# Patient Record
Sex: Male | Born: 1985 | Race: White | Hispanic: No | Marital: Single | Smoking: Current every day smoker
Health system: Southern US, Community
[De-identification: ages and names within clinical notes are randomized; demographics above are authoritative.]

## PROBLEM LIST (undated history)

## (undated) HISTORY — PX: OTHER SURGICAL HISTORY: SHX169

---
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
## 2006-08-13 ENCOUNTER — Emergency Department (HOSPITAL_COMMUNITY): Admission: EM | Admit: 2006-08-13 | Discharge: 2006-08-13 | Payer: Self-pay | Admitting: Emergency Medicine

## 2007-01-24 ENCOUNTER — Emergency Department (HOSPITAL_COMMUNITY): Admission: EM | Admit: 2007-01-24 | Discharge: 2007-01-24 | Payer: Self-pay | Admitting: Emergency Medicine

## 2007-01-24 ENCOUNTER — Ambulatory Visit: Payer: Self-pay | Admitting: Psychiatry

## 2007-01-24 ENCOUNTER — Inpatient Hospital Stay (HOSPITAL_COMMUNITY): Admission: RE | Admit: 2007-01-24 | Discharge: 2007-01-26 | Payer: Self-pay | Admitting: Psychiatry

## 2007-01-29 ENCOUNTER — Inpatient Hospital Stay (HOSPITAL_COMMUNITY): Admission: EM | Admit: 2007-01-29 | Discharge: 2007-01-29 | Payer: Self-pay | Admitting: Emergency Medicine

## 2007-06-17 ENCOUNTER — Emergency Department (HOSPITAL_COMMUNITY): Admission: EM | Admit: 2007-06-17 | Discharge: 2007-06-18 | Payer: Self-pay | Admitting: Emergency Medicine

## 2007-08-08 ENCOUNTER — Emergency Department (HOSPITAL_COMMUNITY): Admission: EM | Admit: 2007-08-08 | Discharge: 2007-08-08 | Payer: Self-pay | Admitting: Emergency Medicine

## 2007-09-15 ENCOUNTER — Emergency Department (HOSPITAL_COMMUNITY): Admission: EM | Admit: 2007-09-15 | Discharge: 2007-09-15 | Payer: Self-pay | Admitting: Emergency Medicine

## 2007-09-25 ENCOUNTER — Emergency Department (HOSPITAL_COMMUNITY): Admission: EM | Admit: 2007-09-25 | Discharge: 2007-09-25 | Payer: Self-pay | Admitting: Emergency Medicine

## 2007-11-02 ENCOUNTER — Emergency Department (HOSPITAL_COMMUNITY): Admission: EM | Admit: 2007-11-02 | Discharge: 2007-11-02 | Payer: Self-pay | Admitting: Emergency Medicine

## 2008-03-04 ENCOUNTER — Emergency Department (HOSPITAL_COMMUNITY): Admission: EM | Admit: 2008-03-04 | Discharge: 2008-03-04 | Payer: Self-pay | Admitting: Emergency Medicine

## 2008-03-20 ENCOUNTER — Emergency Department (HOSPITAL_COMMUNITY): Admission: EM | Admit: 2008-03-20 | Discharge: 2008-03-20 | Payer: Self-pay | Admitting: Emergency Medicine

## 2010-08-21 ENCOUNTER — Encounter: Payer: Self-pay | Admitting: Emergency Medicine
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
## 2011-01-30 ENCOUNTER — Emergency Department (HOSPITAL_COMMUNITY)
Admission: EM | Admit: 2011-01-30 | Discharge: 2011-01-30 | Disposition: A | Discharge status: Home / Self Care | Payer: Self-pay | Attending: Emergency Medicine | Admitting: Emergency Medicine

## 2011-01-30 ENCOUNTER — Emergency Department (HOSPITAL_COMMUNITY)
Admission: EM | Admit: 2011-01-30 | Discharge: 2011-01-31 | Disposition: A | Discharge status: Home / Self Care | Payer: Self-pay | Attending: Emergency Medicine | Admitting: Emergency Medicine

## 2011-01-30 ENCOUNTER — Emergency Department (HOSPITAL_COMMUNITY): Payer: Self-pay

## 2011-01-30 DIAGNOSIS — S41109A Unspecified open wound of unspecified upper arm, initial encounter: Secondary | ICD-10-CM | POA: Insufficient documentation

## 2011-01-30 DIAGNOSIS — Z79899 Other long term (current) drug therapy: Secondary | ICD-10-CM | POA: Insufficient documentation

## 2011-01-30 DIAGNOSIS — M7989 Other specified soft tissue disorders: Secondary | ICD-10-CM | POA: Insufficient documentation

## 2011-01-30 DIAGNOSIS — M25529 Pain in unspecified elbow: Secondary | ICD-10-CM | POA: Insufficient documentation

## 2011-01-30 DIAGNOSIS — M25519 Pain in unspecified shoulder: Secondary | ICD-10-CM | POA: Insufficient documentation

## 2011-02-06 ENCOUNTER — Emergency Department (HOSPITAL_COMMUNITY)
Admission: EM | Admit: 2011-02-06 | Discharge: 2011-02-06 | Disposition: A | Discharge status: Home / Self Care | Payer: Self-pay | Attending: Emergency Medicine | Admitting: Emergency Medicine

## 2011-02-06 DIAGNOSIS — Z4802 Encounter for removal of sutures: Secondary | ICD-10-CM | POA: Insufficient documentation

## 2011-02-06 MED ORDER — HYDROCODONE-ACETAMINOPHEN 5-325 MG PO TABS: 1.0000 | ORAL_TABLET | ORAL | Status: AC | PRN
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
## 2011-02-06 NOTE — ED Notes (Signed)
Here for suture removal-left bicep

## 2011-02-06 NOTE — ED Notes (Signed)
EDPA at bedside to examine 

## 2011-02-06 NOTE — ED Notes (Signed)
Pt presents for suture removal to left arm. Pt states sutures were placed 10 days ago.
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
## 2011-04-21 LAB — INFLUENZA A+B VIRUS AG-DIRECT(RAPID)
Inflenza A Ag: NEGATIVE
Influenza B Ag: NEGATIVE

## 2011-04-21 LAB — STREP A DNA PROBE: Group A Strep Probe: NEGATIVE

## 2011-04-21 LAB — RAPID STREP SCREEN (MED CTR MEBANE ONLY): Streptococcus, Group A Screen (Direct): NEGATIVE

## 2011-05-09 LAB — URINALYSIS, ROUTINE W REFLEX MICROSCOPIC
Ketones, ur: NEGATIVE
Leukocytes, UA: NEGATIVE
Nitrite: NEGATIVE
Specific Gravity, Urine: 1.01
Urobilinogen, UA: 0.2
pH: 6

## 2011-05-09 LAB — BASIC METABOLIC PANEL
CO2: 29
Chloride: 108
GFR calc Af Amer: >60
Potassium: 3.5
Sodium: 145

## 2011-05-09 LAB — CBC
Hemoglobin: 16
MCHC: 34.1
MCV: 89.7
RBC: 5.23

## 2011-05-09 LAB — RAPID URINE DRUG SCREEN, HOSP PERFORMED
Amphetamines: NOT DETECTED
Cocaine: NOT DETECTED
Tetrahydrocannabinol: POSITIVE — AB

## 2011-05-09 LAB — DIFFERENTIAL
Basophils Relative: 0
Eosinophils Absolute: 0.1 — ABNORMAL LOW
Lymphs Abs: 2
Monocytes Absolute: 0.7
Monocytes Relative: 7

## 2011-05-16 LAB — BASIC METABOLIC PANEL
CO2: 29
Calcium: 9.5
Creatinine, Ser: 0.75
GFR calc non Af Amer: >60
Glucose, Bld: 98
Sodium: 135

## 2011-05-16 LAB — CBC
Hemoglobin: 15.8
MCHC: 34
Platelets: 260
RDW: 13.8

## 2011-05-17 LAB — COMPREHENSIVE METABOLIC PANEL
AST: 24
Albumin: 4.3
Alkaline Phosphatase: 69
BUN: 7
Calcium: 9.6
Creatinine, Ser: 0.9
GFR calc non Af Amer: >60
Glucose, Bld: 109 — ABNORMAL HIGH
Sodium: 141
Total Bilirubin: 0.7
Total Protein: 7.1

## 2011-05-17 LAB — DIFFERENTIAL
Basophils Absolute: 0.1
Basophils Relative: 0
Basophils Relative: 0
Eosinophils Absolute: 0
Eosinophils Absolute: 0
Eosinophils Absolute: 0.1
Eosinophils Relative: 0
Lymphs Abs: 1.6
Monocytes Absolute: 0.9 — ABNORMAL HIGH
Monocytes Absolute: 1 — ABNORMAL HIGH
Monocytes Absolute: 1.2 — ABNORMAL HIGH
Monocytes Relative: 10
Monocytes Relative: 6
Neutrophils Relative %: 77

## 2011-05-17 LAB — CBC
HCT: 44.8
Hemoglobin: 15.4
Hemoglobin: 16
Hemoglobin: 16.7
MCHC: 34.1
MCV: 89.9
MCV: 90.2
MCV: 90.9
Platelets: 227
RBC: 4.96
RBC: 5.23
RBC: 5.38
WBC: 12.6 — ABNORMAL HIGH
WBC: 14.8 — ABNORMAL HIGH

## 2011-05-17 LAB — URINALYSIS, ROUTINE W REFLEX MICROSCOPIC
Leukocytes, UA: NEGATIVE
Leukocytes, UA: NEGATIVE
Nitrite: NEGATIVE
Nitrite: NEGATIVE
Protein, ur: 30 — AB
Specific Gravity, Urine: 1.025
Specific Gravity, Urine: >1.03 — ABNORMAL HIGH
Urobilinogen, UA: 0.2
pH: 5.5

## 2011-05-17 LAB — BASIC METABOLIC PANEL
CO2: 28
Chloride: 106
Chloride: 98
Creatinine, Ser: 0.87
Creatinine, Ser: 0.92
GFR calc Af Amer: >60
GFR calc Af Amer: >60
Potassium: 3.3 — ABNORMAL LOW
Sodium: 138
Sodium: 143

## 2011-05-17 LAB — ETHANOL
Alcohol, Ethyl (B): 111 — ABNORMAL HIGH
Alcohol, Ethyl (B): <5

## 2011-05-17 LAB — URINE MICROSCOPIC-ADD ON: Urine-Other: NONE SEEN

## 2011-05-17 LAB — RAPID URINE DRUG SCREEN, HOSP PERFORMED
Barbiturates: NOT DETECTED
Benzodiazepines: NOT DETECTED
Benzodiazepines: POSITIVE — AB
Tetrahydrocannabinol: POSITIVE — AB

## 2012-02-22 ENCOUNTER — Emergency Department (HOSPITAL_COMMUNITY)
Admission: EM | Admit: 2012-02-22 | Discharge: 2012-02-22 | Disposition: A | Discharge status: Home / Self Care | Payer: Self-pay | Attending: Emergency Medicine | Admitting: Emergency Medicine

## 2012-02-22 ENCOUNTER — Encounter (HOSPITAL_COMMUNITY): Payer: Self-pay | Admitting: Emergency Medicine

## 2012-02-22 DIAGNOSIS — F17200 Nicotine dependence, unspecified, uncomplicated: Secondary | ICD-10-CM | POA: Insufficient documentation

## 2012-02-22 DIAGNOSIS — K089 Disorder of teeth and supporting structures, unspecified: Secondary | ICD-10-CM | POA: Insufficient documentation

## 2012-02-22 DIAGNOSIS — Z7982 Long term (current) use of aspirin: Secondary | ICD-10-CM | POA: Insufficient documentation

## 2012-02-22 DIAGNOSIS — K0889 Other specified disorders of teeth and supporting structures: Secondary | ICD-10-CM

## 2012-02-22 MED ORDER — CEPHALEXIN 500 MG PO CAPS: 500.0000 mg | ORAL_CAPSULE | Freq: Four times a day (QID) | ORAL | Status: AC

## 2012-02-22 MED ORDER — TRAMADOL HCL 50 MG PO TABS
100.0000 mg | ORAL_TABLET | Freq: Once | ORAL | Status: AC
  Administered 2012-02-22: 100 mg via ORAL
  Filled 2012-02-22: qty 2

## 2012-02-22 MED ORDER — KETOROLAC TROMETHAMINE 10 MG PO TABS
10.0000 mg | ORAL_TABLET | Freq: Once | ORAL | Status: AC
  Administered 2012-02-22: 10 mg via ORAL
  Filled 2012-02-22: qty 1

## 2012-02-22 MED ORDER — HYDROCODONE-ACETAMINOPHEN 5-325 MG PO TABS: ORAL_TABLET | ORAL | Status: DC

## 2012-02-22 MED ORDER — MELOXICAM 7.5 MG PO TABS: ORAL_TABLET | ORAL | Status: DC

## 2012-02-22 MED ORDER — CEPHALEXIN 500 MG PO CAPS
500.0000 mg | ORAL_CAPSULE | Freq: Once | ORAL | Status: AC
  Administered 2012-02-22: 500 mg via ORAL
  Filled 2012-02-22: qty 1

## 2012-02-22 MED ORDER — PROMETHAZINE HCL 12.5 MG PO TABS
12.5000 mg | ORAL_TABLET | Freq: Once | ORAL | Status: AC
  Administered 2012-02-22: 12.5 mg via ORAL
  Filled 2012-02-22: qty 1

## 2012-02-22 NOTE — ED Notes (Signed)
Patient c/o right lower back tooth pain x 3 days.

## 2012-02-22 NOTE — ED Notes (Signed)
Pt stable at discharge with steady gait Pt instructed to take all antibiotics as prescribed; pt educated not to drive while on pain medication and also instructed not to take tylenol or advil while taking prescribed pain medication; Pt verbalizes understanding

## 2012-02-22 NOTE — ED Notes (Signed)
Pt c/o R lower tooth pain. R jaw slightly swollen. Pt does not have a primary Dental Dr; however, pt is on the list for the free clinic; per pt he is number 20 on the list to be seen. Tooth located R back lower area and  is decayed,brown, and painful

## 2012-02-22 NOTE — ED Provider Notes (Signed)
History     CSN: [id number]  Arrival date & time 02/22/12  1924   None     Chief Complaint  Patient presents with  . Dental Pain    (Consider location/radiation/quality/duration/timing/severity/associated sxs/prior treatment) Patient is a 26 y.o. male presenting with tooth pain. The history is provided by the patient.  Dental PainThe primary symptoms include mouth pain and headaches. Primary symptoms do not include shortness of breath or cough. The symptoms began 3 to 5 days ago. The symptoms are worsening. The symptoms occur frequently.  The headache is not associated with photophobia.  Additional symptoms include: dental sensitivity to temperature, gum swelling and jaw pain. Additional symptoms do not include: facial swelling and nosebleeds. Medical issues include: smoking.    History reviewed. No pertinent past medical history.  History reviewed. No pertinent past surgical history.  No family history on file.  History  Substance Use Topics  . Smoking status: Current Everyday Smoker -- 1.5 packs/day    Types: Cigarettes  . Smokeless tobacco: Not on file  . Alcohol Use: No      Review of Systems  Constitutional: Negative for activity change.       All ROS Neg except as noted in HPI  HENT: Negative for nosebleeds, facial swelling and neck pain.   Eyes: Negative for photophobia and discharge.  Respiratory: Negative for cough, shortness of breath and wheezing.   Cardiovascular: Negative for chest pain and palpitations.  Gastrointestinal: Negative for abdominal pain and blood in stool.  Genitourinary: Negative for dysuria, frequency and hematuria.  Musculoskeletal: Negative for back pain and arthralgias.  Skin: Negative.   Neurological: Positive for headaches. Negative for dizziness, seizures and speech difficulty.  Psychiatric/Behavioral: Negative for hallucinations and confusion.    Allergies  Bactrim and Penicillins  Home Medications   Current Outpatient Rx    Name Route Sig Dispense Refill  . ACETAMINOPHEN-CODEINE #3 300-30 MG PO TABS Oral Take 2 tablets by mouth once as needed.    . ASPIRIN-SALICYLAMIDE-CAFFEINE 325-95-16 MG PO TABS Oral Take 1 packet by mouth every 4 (four) hours as needed.    . IBUPROFEN 200 MG PO TABS Oral Take 400 mg by mouth every 4 (four) hours as needed.      BP 141/85  Pulse 89  Temp 98.4 F (36.9 C) (Oral)  Resp 20  Ht 6' (1.829 m)  Wt 155 lb (70.308 kg)  BMI 21.02 kg/m2  SpO2 100%  Physical Exam  Nursing note and vitals reviewed. Constitutional: He is oriented to person, place, and time. He appears well-developed and well-nourished.  Non-toxic appearance.  HENT:  Head: Normocephalic.  Right Ear: Tympanic membrane and external ear normal.  Left Ear: Tympanic membrane and external ear normal.       There is a deep cavity of the second molar on the right lower jaw area. There is increased swelling around the tooth. There is no visible abscess appreciated. There are multiple cavities of the right and left upper and lower jaw area. The airway is patent.  Eyes: EOM and lids are normal. Pupils are equal, round, and reactive to light.  Neck: Normal range of motion. Neck supple. Carotid bruit is not present.  Cardiovascular: Normal rate, regular rhythm, normal heart sounds, intact distal pulses and normal pulses.   Pulmonary/Chest: Breath sounds normal. No respiratory distress.  Abdominal: Soft. Bowel sounds are normal. There is no tenderness. There is no guarding.  Musculoskeletal: Normal range of motion.  Lymphadenopathy:  Head (right side): No submandibular adenopathy present.       Head (left side): No submandibular adenopathy present.    He has no cervical adenopathy.  Neurological: He is alert and oriented to person, place, and time. He has normal strength. No cranial nerve deficit or sensory deficit.  Skin: Skin is warm and dry.  Psychiatric: He has a normal mood and affect. His speech is normal.     ED Course  Procedures (including critical care time)  Labs Reviewed - No data to display No results found.   No diagnosis found.    MDM  I have reviewed nursing notes, vital signs, and all appropriate lab and imaging results for this patient. Patient states he has been placed on the waiting list for the free dental clinic. He's been trying BC powders but these are not helping anymore for his pain. Prescription for Norco, Mobic, and Keflex given to the patient.       Kathie Dike, [place] 02/22/12 2042

## 2012-02-23 NOTE — ED Provider Notes (Signed)
Medical screening examination/treatment/procedure(s) were performed by non-physician practitioner and as supervising physician I was immediately available for consultation/collaboration.   Carleene Cooper III, MD 02/23/12 [phone]

## 2012-03-28 ENCOUNTER — Emergency Department (HOSPITAL_COMMUNITY)
Admission: EM | Admit: 2012-03-28 | Discharge: 2012-03-28 | Disposition: A | Discharge status: Home / Self Care | Payer: Self-pay | Attending: Emergency Medicine | Admitting: Emergency Medicine

## 2012-03-28 ENCOUNTER — Encounter (HOSPITAL_COMMUNITY): Payer: Self-pay | Admitting: Emergency Medicine

## 2012-03-28 DIAGNOSIS — F17200 Nicotine dependence, unspecified, uncomplicated: Secondary | ICD-10-CM | POA: Insufficient documentation

## 2012-03-28 DIAGNOSIS — K0889 Other specified disorders of teeth and supporting structures: Secondary | ICD-10-CM

## 2012-03-28 DIAGNOSIS — K029 Dental caries, unspecified: Secondary | ICD-10-CM | POA: Insufficient documentation

## 2012-03-28 MED ORDER — IBUPROFEN 400 MG PO TABS
400.0000 mg | ORAL_TABLET | Freq: Once | ORAL | Status: AC
  Administered 2012-03-28: 400 mg via ORAL
  Filled 2012-03-28: qty 1

## 2012-03-28 MED ORDER — HYDROCODONE-ACETAMINOPHEN 5-325 MG PO TABS: ORAL_TABLET | ORAL | Status: AC

## 2012-03-28 MED ORDER — ERYTHROMYCIN BASE 250 MG PO TABS: 250.0000 mg | ORAL_TABLET | Freq: Four times a day (QID) | ORAL | Status: AC

## 2012-03-28 MED ORDER — OXYCODONE-ACETAMINOPHEN 5-325 MG PO TABS
1.0000 | ORAL_TABLET | Freq: Once | ORAL | Status: AC
  Administered 2012-03-28: 1 via ORAL
  Filled 2012-03-28: qty 1

## 2012-03-28 MED ORDER — NAPROXEN 250 MG PO TABS: 250.0000 mg | ORAL_TABLET | Freq: Two times a day (BID) | ORAL | Status: DC

## 2012-03-28 NOTE — ED Notes (Signed)
Patient complaining of toothache to right lower teeth that started approximately 3 days ago.

## 2012-03-28 NOTE — ED Provider Notes (Signed)
History     CSN: [id number]  Arrival date & time 03/28/12  2033   First MD Initiated Contact with Patient 03/28/12 2036      Chief Complaint  Patient presents with  . Dental Pain     HPI Pt was seen at 2055.  Per pt, c/o gradual onset and persistence of constant right lower tooth "pain" for the past 3 days.  Denies fevers, no intra-oral edema, no rash, no facial swelling, no dysphagia, no neck pain.   The condition is aggravated by nothing. The condition is relieved by nothing. The symptoms have been associated with no other complaints. The patient has no significant history of serious medical conditions.    History reviewed. No pertinent past medical history.  History reviewed. No pertinent past surgical history.   History  Substance Use Topics  . Smoking status: Current Everyday Smoker -- 1.5 packs/day    Types: Cigarettes  . Smokeless tobacco: Not on file  . Alcohol Use: No      Review of Systems ROS: Statement: All systems negative except as marked or noted in the HPI; Constitutional: Negative for fever and chills. ; ; Eyes: Negative for eye pain and discharge. ; ; ENMT: Positive for dental caries, dental hygiene poor and toothache. Negative for ear pain, bleeding gums, dental injury, facial deformity, facial swelling, hoarseness, nasal congestion, sinus pressure, sore throat, throat swelling and tongue swollen. ; ; Cardiovascular: Negative for chest pain, palpitations, diaphoresis, dyspnea and peripheral edema. ; ; Respiratory: Negative for cough, wheezing and stridor. ; ; Gastrointestinal: Negative for nausea, vomiting, diarrhea and abdominal pain. ; ; Genitourinary: Negative for dysuria, flank pain and hematuria. ; ; Musculoskeletal: Negative for back pain and neck pain. ; ; Skin: Negative for rash and skin lesion. ; ; Neuro: Negative for headache, lightheadedness and neck stiffness. ;    Allergies  Bactrim and Penicillins  Home Medications   Current Outpatient Rx    Name Route Sig Dispense Refill  . ACETAMINOPHEN-CODEINE #3 300-30 MG PO TABS Oral Take 2 tablets by mouth once as needed.    . ASPIRIN-SALICYLAMIDE-CAFFEINE 325-95-16 MG PO TABS Oral Take 1 packet by mouth every 4 (four) hours as needed.    . ERYTHROMYCIN BASE 250 MG PO TABS Oral Take 1 tablet (250 mg total) by mouth every 6 (six) hours. 20 tablet 0  . HYDROCODONE-ACETAMINOPHEN 5-325 MG PO TABS  1 or 2 po q4h prn pain 20 tablet 0  . HYDROCODONE-ACETAMINOPHEN 5-325 MG PO TABS  1 or 2 tabs PO q6 hours prn pain 20 tablet 0  . IBUPROFEN 200 MG PO TABS Oral Take 400 mg by mouth every 4 (four) hours as needed.    . MELOXICAM 7.5 MG PO TABS  1 po bid with food 12 tablet 0  . NAPROXEN 250 MG PO TABS Oral Take 1 tablet (250 mg total) by mouth 2 (two) times daily with a meal. 14 tablet 0    BP 137/99  Pulse 88  Temp 98.2 F (36.8 C) (Oral)  Resp 16  Ht 6' (1.829 m)  Wt 150 lb (68.04 kg)  BMI 20.34 kg/m2  SpO2 100%  Physical Exam 2100: Physical examination: Vital signs and O2 SAT: Reviewed; Constitutional: Well developed, Well nourished, Well hydrated, In no acute distress; Head and Face: Normocephalic, Atraumatic; Eyes: EOMI, PERRL, No scleral icterus; ENMT: Mouth and pharynx normal, Poor dentition, Widespread dental decay, Left TM normal, Right TM normal, Mucous membranes moist, +lower right 2nd molar with extensive dental  decay.  No gingival erythema, edema, fluctuance, or drainage.  No hoarse voice, no drooling, no stridor.  ; Neck: Supple, Full range of motion, No lymphadenopathy; Cardiovascular: Regular rate and rhythm, No murmur, rub, or gallop; Respiratory: Breath sounds clear & equal bilaterally, No rales, rhonchi, wheezes, or rub, Normal respiratory effort/excursion; Chest: Nontender, Movement normal; Extremities: Pulses normal, No tenderness, No edema; Neuro: AA&Ox3, Major CN grossly intact.  No gross focal motor or sensory deficits in extremities.; Skin: Color normal, No rash, No petechiae,  Warm, Dry    ED Course  Procedures     MDM  MDM Reviewed: nursing note, vitals and previous chart      2110:  Pt encouraged to f/u with dentist or oral surgeon for his dental needs for good continuity of care and definitive treatment.  Verb understanding.        Laray Anger, DO 03/29/12 1013

## 2012-03-28 NOTE — ED Notes (Signed)
Patient with no complaints at this time. Respirations even and unlabored. Skin warm/dry. Discharge instructions reviewed with patient at this time. Patient given opportunity to voice concerns/ask questions. Patient discharged at this time and left Emergency Department with steady gait.   

## 2012-04-07 ENCOUNTER — Emergency Department (HOSPITAL_COMMUNITY)
Admission: EM | Admit: 2012-04-07 | Discharge: 2012-04-07 | Disposition: A | Discharge status: Home / Self Care | Payer: Self-pay | Attending: Emergency Medicine | Admitting: Emergency Medicine

## 2012-04-07 ENCOUNTER — Encounter (HOSPITAL_COMMUNITY): Payer: Self-pay | Admitting: *Deleted

## 2012-04-07 DIAGNOSIS — F17200 Nicotine dependence, unspecified, uncomplicated: Secondary | ICD-10-CM | POA: Insufficient documentation

## 2012-04-07 DIAGNOSIS — K0889 Other specified disorders of teeth and supporting structures: Secondary | ICD-10-CM

## 2012-04-07 DIAGNOSIS — K089 Disorder of teeth and supporting structures, unspecified: Secondary | ICD-10-CM | POA: Insufficient documentation

## 2012-04-07 DIAGNOSIS — Z881 Allergy status to other antibiotic agents status: Secondary | ICD-10-CM | POA: Insufficient documentation

## 2012-04-07 DIAGNOSIS — Z880 Allergy status to penicillin: Secondary | ICD-10-CM | POA: Insufficient documentation

## 2012-04-07 MED ORDER — ERYTHROMYCIN BASE 250 MG PO TABS: 250.0000 mg | ORAL_TABLET | Freq: Four times a day (QID) | ORAL | Status: AC

## 2012-04-07 MED ORDER — OXYCODONE-ACETAMINOPHEN 5-325 MG PO TABS
1.0000 | ORAL_TABLET | Freq: Once | ORAL | Status: AC
  Administered 2012-04-07: 1 via ORAL
  Filled 2012-04-07: qty 1

## 2012-04-07 MED ORDER — NAPROXEN 500 MG PO TABS: 500.0000 mg | ORAL_TABLET | Freq: Two times a day (BID) | ORAL | Status: DC

## 2012-04-07 MED ORDER — NAPROXEN 250 MG PO TABS
500.0000 mg | ORAL_TABLET | Freq: Once | ORAL | Status: AC
  Administered 2012-04-07: 500 mg via ORAL
  Filled 2012-04-07: qty 2

## 2012-04-07 MED ORDER — HYDROCODONE-ACETAMINOPHEN 5-325 MG PO TABS: ORAL_TABLET | ORAL | Status: AC

## 2012-04-07 NOTE — ED Provider Notes (Signed)
History     CSN: [id number]  Arrival date & time 04/07/12  0115   First MD Initiated Contact with Patient 04/07/12 [phone]      Chief Complaint  Patient presents with  . Dental Pain    (Consider location/radiation/quality/duration/timing/severity/associated sxs/prior treatment) Patient is a 26 y.o. male presenting with tooth pain. The history is provided by the patient.  Dental PainThe primary symptoms include mouth pain. Primary symptoms do not include oral bleeding, oral lesions, headaches, fever, shortness of breath, sore throat, angioedema or cough. The symptoms began more than 1 month ago. The symptoms are waxing and waning. The symptoms are recurrent. The symptoms occur constantly.  Affected locations include: teeth and gum(s).  Additional symptoms include: dental sensitivity to temperature and gum tenderness. Additional symptoms do not include: gum swelling, purulent gums, trismus, jaw pain, facial swelling, trouble swallowing, pain with swallowing, ear pain and nosebleeds. Medical issues include: smoking and periodontal disease.    History reviewed. No pertinent past medical history.  History reviewed. No pertinent past surgical history.  No family history on file.  History  Substance Use Topics  . Smoking status: Current Everyday Smoker -- 1.5 packs/day    Types: Cigarettes  . Smokeless tobacco: Not on file  . Alcohol Use: No      Review of Systems  Constitutional: Negative for fever and appetite change.  HENT: Positive for dental problem. Negative for ear pain, nosebleeds, congestion, sore throat, facial swelling, trouble swallowing, neck pain and neck stiffness.   Eyes: Negative for pain and visual disturbance.  Respiratory: Negative for cough and shortness of breath.   Neurological: Negative for dizziness, facial asymmetry and headaches.  Hematological: Negative for adenopathy.  All other systems reviewed and are negative.    Allergies  Bactrim and  Penicillins  Home Medications   Current Outpatient Rx  Name Route Sig Dispense Refill  . ASPIRIN-SALICYLAMIDE-CAFFEINE 325-95-16 MG PO TABS Oral Take 1 packet by mouth every 4 (four) hours as needed. For pain    . IBUPROFEN 200 MG PO TABS Oral Take 400 mg by mouth every 4 (four) hours as needed. For pain    . ERYTHROMYCIN BASE 250 MG PO TABS Oral Take 1 tablet (250 mg total) by mouth every 6 (six) hours. 20 tablet 0  . HYDROCODONE-ACETAMINOPHEN 5-325 MG PO TABS  1 or 2 tabs PO q6 hours prn pain 20 tablet 0  . NAPROXEN 250 MG PO TABS Oral Take 1 tablet (250 mg total) by mouth 2 (two) times daily with a meal. 14 tablet 0    BP 141/84  Pulse 66  Temp 97.9 F (36.6 C) (Oral)  Resp 20  Ht 6\' 6"  (1.981 m)  Wt 150 lb (68.04 kg)  BMI 17.33 kg/m2  SpO2 100%  Physical Exam  Nursing note and vitals reviewed. Constitutional: He is oriented to person, place, and time. He appears well-developed and well-nourished. No distress.  HENT:  Head: Normocephalic and atraumatic. No trismus in the jaw.  Right Ear: Tympanic membrane and ear canal normal.  Left Ear: Tympanic membrane and ear canal normal.  Mouth/Throat: Uvula is midline, oropharynx is clear and moist and mucous membranes are normal. Dental caries present. No dental abscesses or uvula swelling.       Multiple dental caries and decay with ttp of the right lower molars and surrounding gums.  No abscess, facial swelling or trismus  Neck: Normal range of motion. Neck supple.  Cardiovascular: Normal rate, regular rhythm and normal heart sounds.  No murmur heard. Pulmonary/Chest: Effort normal and breath sounds normal.  Musculoskeletal: Normal range of motion.  Lymphadenopathy:    He has no cervical adenopathy.  Neurological: He is alert and oriented to person, place, and time. He exhibits normal muscle tone. Coordination normal.  Skin: Skin is warm and dry.    ED Course  Procedures (including critical care time)  Labs Reviewed - No  data to display No results found.     MDM    Previous ED charts reviewed.  Pt has appt with Dr. Lovell Sheehan DDS on Thursday 04/11/12.    The patient appears reasonably screened and/or stabilized for discharge and I doubt any other medical condition or other [facility] requiring further screening, evaluation, or treatment in the ED at this time prior to discharge.    Prescribed: norco # 15 E-mycin naprosyn     Itzel Lowrimore L. Ayven Pheasant, [place] 04/07/12 9147

## 2012-04-07 NOTE — ED Notes (Signed)
Pharmacist from [facility] called requesting erythromycin to be changed to something cheaper.  Notified PA, Al Decant and he changed prescription to clindamycin 150mg  2 tabs tid dispense 60.

## 2012-04-07 NOTE — ED Notes (Signed)
Pt left by self and states having ride home. Pt left with discharge instructions and prescriptions. Pt verbalized understanding of discharge instructions and medications.

## 2012-04-07 NOTE — ED Notes (Signed)
Pt states been having dental pain on & off for the last 2 months. Pt seen here last month & is out of medication.

## 2012-04-08 NOTE — ED Provider Notes (Signed)
Medical screening examination/treatment/procedure(s) were performed by non-physician practitioner and as supervising physician I was immediately available for consultation/collaboration.  Nicoletta Dress. Colon Branch, MD 04/08/12 [phone]

## 2012-05-30 ENCOUNTER — Emergency Department (HOSPITAL_COMMUNITY)
Admission: EM | Admit: 2012-05-30 | Discharge: 2012-05-30 | Disposition: A | Discharge status: Home / Self Care | Payer: Self-pay | Attending: Emergency Medicine | Admitting: Emergency Medicine

## 2012-05-30 ENCOUNTER — Encounter (HOSPITAL_COMMUNITY): Payer: Self-pay | Admitting: Emergency Medicine

## 2012-05-30 DIAGNOSIS — K089 Disorder of teeth and supporting structures, unspecified: Secondary | ICD-10-CM | POA: Insufficient documentation

## 2012-05-30 DIAGNOSIS — K0889 Other specified disorders of teeth and supporting structures: Secondary | ICD-10-CM

## 2012-05-30 DIAGNOSIS — F17200 Nicotine dependence, unspecified, uncomplicated: Secondary | ICD-10-CM | POA: Insufficient documentation

## 2012-05-30 MED ORDER — CEPHALEXIN 500 MG PO CAPS: 500.0000 mg | ORAL_CAPSULE | Freq: Four times a day (QID) | ORAL | Status: DC

## 2012-05-30 MED ORDER — HYDROCODONE-ACETAMINOPHEN 5-325 MG PO TABS: 1.0000 | ORAL_TABLET | ORAL | Status: DC | PRN

## 2012-05-30 MED ORDER — CEPHALEXIN 500 MG PO CAPS
500.0000 mg | ORAL_CAPSULE | Freq: Once | ORAL | Status: AC
  Administered 2012-05-30: 500 mg via ORAL
  Filled 2012-05-30: qty 1

## 2012-05-30 MED ORDER — TRAMADOL HCL 50 MG PO TABS: 50.0000 mg | ORAL_TABLET | Freq: Four times a day (QID) | ORAL | Status: DC | PRN

## 2012-05-30 MED ORDER — HYDROCODONE-ACETAMINOPHEN 5-325 MG PO TABS: 1.0000 | ORAL_TABLET | ORAL | Status: AC | PRN

## 2012-05-30 MED ORDER — TRAMADOL HCL 50 MG PO TABS
50.0000 mg | ORAL_TABLET | Freq: Once | ORAL | Status: AC
  Administered 2012-05-30: 50 mg via ORAL
  Filled 2012-05-30: qty 1

## 2012-05-30 NOTE — ED Notes (Signed)
Pt c/o L lower dental pain. Pt states a piece of his tooth broke off.

## 2012-05-31 NOTE — ED Provider Notes (Signed)
Medical screening examination/treatment/procedure(s) were performed by non-physician practitioner and as supervising physician I was immediately available for consultation/collaboration.   Benny Lennert, MD 05/31/12 [phone]

## 2012-05-31 NOTE — ED Provider Notes (Signed)
History     CSN: [id number]  Arrival date & time 05/30/12  2058   First MD Initiated Contact with Patient 05/30/12 2128      Chief Complaint  Patient presents with  . Dental Pain    (Consider location/radiation/quality/duration/timing/severity/associated sxs/prior treatment) HPI Comments: AZAVIER CRESON  presents with a 1 day history of dental pain and gingival swelling.   The patient has a history of injury to the tooth involved which has recently started to cause pain.  There has been no fevers,  Chills, nausea or vomiting, also no complaint of difficulty swallowing,  Although chewing makes pain worse.  The patient has tried ibuprofen without relief of symptoms.      The history is provided by the patient.    History reviewed. No pertinent past medical history.  History reviewed. No pertinent past surgical history.  History reviewed. No pertinent family history.  History  Substance Use Topics  . Smoking status: Current Every Day Smoker -- 1.5 packs/day    Types: Cigarettes  . Smokeless tobacco: Never Used  . Alcohol Use: No      Review of Systems  Constitutional: Negative for fever.  HENT: Positive for dental problem. Negative for sore throat, facial swelling, neck pain and neck stiffness.   Respiratory: Negative for shortness of breath.     Allergies  Bactrim and Penicillins  Home Medications   Current Outpatient Rx  Name Route Sig Dispense Refill  . HYDROCODONE-ACETAMINOPHEN 5-325 MG PO TABS Oral Take 1 tablet by mouth daily as needed. For pain (#3 tablets remaining from previous rx)    . IBUPROFEN 200 MG PO TABS Oral Take 800 mg by mouth every 4 (four) hours as needed. For pain    . CEPHALEXIN 500 MG PO CAPS Oral Take 1 capsule (500 mg total) by mouth 4 (four) times daily. 28 capsule 0  . HYDROCODONE-ACETAMINOPHEN 5-325 MG PO TABS Oral Take 1 tablet by mouth every 4 (four) hours as needed for pain. 10 tablet 0    BP 137/81  Pulse 89  Temp 98 F  (36.7 C) (Oral)  Resp 20  Ht 5\' 11"  (1.803 m)  Wt 155 lb (70.308 kg)  BMI 21.62 kg/m2  SpO2 100%  Physical Exam  Constitutional: He is oriented to person, place, and time. He appears well-developed and well-nourished. No distress.  HENT:  Head: Normocephalic and atraumatic.  Right Ear: Tympanic membrane and external ear normal.  Left Ear: Tympanic membrane and external ear normal.  Nose: Nose normal.  Mouth/Throat: Oropharynx is clear and moist and mucous membranes are normal. No oral lesions. Dental abscesses present. No posterior oropharyngeal edema or posterior oropharyngeal erythema.    Eyes: Conjunctivae normal are normal.  Neck: Normal range of motion. Neck supple.  Cardiovascular: Normal rate and normal heart sounds.   Pulmonary/Chest: Effort normal.  Abdominal: He exhibits no distension.  Musculoskeletal: Normal range of motion.  Lymphadenopathy:    He has no cervical adenopathy.  Neurological: He is alert and oriented to person, place, and time.  Skin: Skin is warm and dry. No erythema.  Psychiatric: He has a normal mood and affect.    ED Course  Procedures (including critical care time)  Labs Reviewed - No data to display No results found.   1. Toothache       MDM  Pt states he has an appointment with Dr Lovell Sheehan next week for eval of this tooth.  Pt is pen allergic,  But has tolerated keflex previously  for a dental abscess without reaction.  He is unable to afford clinadamycin.  Pt prescribed keflex along with hydrocodone.  Encouraged to keep appt with Dr Lovell Sheehan next week.        Burgess Amor, [place] 05/31/12 2157

## 2012-08-01 ENCOUNTER — Encounter (HOSPITAL_COMMUNITY): Payer: Self-pay | Admitting: *Deleted

## 2012-08-01 ENCOUNTER — Emergency Department (HOSPITAL_COMMUNITY)
Admission: EM | Admit: 2012-08-01 | Discharge: 2012-08-01 | Disposition: A | Discharge status: Home / Self Care | Payer: Self-pay | Attending: Emergency Medicine | Admitting: Emergency Medicine

## 2012-08-01 DIAGNOSIS — K0889 Other specified disorders of teeth and supporting structures: Secondary | ICD-10-CM

## 2012-08-01 DIAGNOSIS — K089 Disorder of teeth and supporting structures, unspecified: Secondary | ICD-10-CM | POA: Insufficient documentation

## 2012-08-01 DIAGNOSIS — K029 Dental caries, unspecified: Secondary | ICD-10-CM | POA: Insufficient documentation

## 2012-08-01 DIAGNOSIS — F17200 Nicotine dependence, unspecified, uncomplicated: Secondary | ICD-10-CM | POA: Insufficient documentation

## 2012-08-01 MED ORDER — HYDROCODONE-ACETAMINOPHEN 5-325 MG PO TABS
1.0000 | ORAL_TABLET | Freq: Once | ORAL | Status: AC
  Administered 2012-08-01: 1 via ORAL
  Filled 2012-08-01: qty 1

## 2012-08-01 MED ORDER — HYDROCODONE-ACETAMINOPHEN 5-325 MG PO TABS: 1.0000 | ORAL_TABLET | ORAL | Status: AC | PRN

## 2012-08-01 MED ORDER — CEPHALEXIN 500 MG PO CAPS
500.0000 mg | ORAL_CAPSULE | Freq: Once | ORAL | Status: AC
  Administered 2012-08-01: 500 mg via ORAL
  Filled 2012-08-01: qty 1

## 2012-08-01 MED ORDER — ONDANSETRON HCL 4 MG PO TABS
4.0000 mg | ORAL_TABLET | Freq: Once | ORAL | Status: AC
  Administered 2012-08-01: 4 mg via ORAL
  Filled 2012-08-01: qty 1

## 2012-08-01 MED ORDER — KETOROLAC TROMETHAMINE 10 MG PO TABS
10.0000 mg | ORAL_TABLET | Freq: Once | ORAL | Status: AC
  Administered 2012-08-01: 10 mg via ORAL
  Filled 2012-08-01: qty 1

## 2012-08-01 MED ORDER — MELOXICAM 7.5 MG PO TABS: ORAL_TABLET | ORAL | Status: DC

## 2012-08-01 MED ORDER — CEPHALEXIN 500 MG PO CAPS: 500.0000 mg | ORAL_CAPSULE | Freq: Four times a day (QID) | ORAL | Status: DC

## 2012-08-01 NOTE — ED Provider Notes (Signed)
History     CSN: [id number]  Arrival date & time 08/01/12  0024   First MD Initiated Contact with Patient 08/01/12 [phone]      Chief Complaint  Patient presents with  . Dental Pain    (Consider location/radiation/quality/duration/timing/severity/associated sxs/prior treatment) Patient is a 27 y.o. male presenting with tooth pain. The history is provided by the patient.  Dental PainThe primary symptoms include mouth pain. Primary symptoms do not include shortness of breath or cough. The symptoms began 2 days ago. The symptoms are unchanged. The symptoms occur constantly.  Additional symptoms include: gum swelling and jaw pain. Additional symptoms do not include: facial swelling, drooling and nosebleeds. Medical issues include: smoking.    History reviewed. No pertinent past medical history.  History reviewed. No pertinent past surgical history.  History reviewed. No pertinent family history.  History  Substance Use Topics  . Smoking status: Current Every Day Smoker -- 1.5 packs/day    Types: Cigarettes  . Smokeless tobacco: Never Used  . Alcohol Use: No      Review of Systems  Constitutional: Negative for activity change.       All ROS Neg except as noted in HPI  HENT: Positive for dental problem. Negative for nosebleeds, facial swelling, drooling and neck pain.   Eyes: Negative for photophobia and discharge.  Respiratory: Negative for cough, shortness of breath and wheezing.   Cardiovascular: Negative for chest pain and palpitations.  Gastrointestinal: Negative for abdominal pain and blood in stool.  Genitourinary: Negative for dysuria, frequency and hematuria.  Musculoskeletal: Negative for back pain and arthralgias.  Skin: Negative.   Neurological: Negative for dizziness, seizures and speech difficulty.  Psychiatric/Behavioral: Negative for hallucinations and confusion.    Allergies  Bactrim and Penicillins  Home Medications   Current Outpatient Rx  Name  Route   Sig  Dispense  Refill  . IBUPROFEN 200 MG PO TABS   Oral   Take 800 mg by mouth every 4 (four) hours as needed. For pain         . CEPHALEXIN 500 MG PO CAPS   Oral   Take 1 capsule (500 mg total) by mouth 4 (four) times daily.   28 capsule   0   . HYDROCODONE-ACETAMINOPHEN 5-325 MG PO TABS   Oral   Take 1 tablet by mouth daily as needed. For pain (#3 tablets remaining from previous rx)           BP 122/78  Pulse 88  Temp 98.4 F (36.9 C) (Oral)  Resp 18  Ht 6' (1.829 m)  Wt 150 lb (68.04 kg)  BMI 20.34 kg/m2  SpO2 98%  Physical Exam  Nursing note and vitals reviewed. Constitutional: He is oriented to person, place, and time. He appears well-developed and well-nourished.  Non-toxic appearance.  HENT:  Head: Normocephalic.  Right Ear: Tympanic membrane and external ear normal.  Left Ear: Tympanic membrane and external ear normal.       Multiple dental caries on the right and the left lower jaw. Mod swelling of the gums. No visible abscess. No swelling under the tongue. Airway patent.  Eyes: EOM and lids are normal. Pupils are equal, round, and reactive to light.  Neck: Normal range of motion. Neck supple. Carotid bruit is not present.  Cardiovascular: Normal rate, regular rhythm, normal heart sounds, intact distal pulses and normal pulses.   No murmur heard. Pulmonary/Chest: Breath sounds normal. No respiratory distress.  Abdominal: Soft. Bowel sounds are normal. There is  no tenderness. There is no guarding.  Musculoskeletal: Normal range of motion.  Lymphadenopathy:       Head (right side): No submandibular adenopathy present.       Head (left side): No submandibular adenopathy present.    He has no cervical adenopathy.  Neurological: He is alert and oriented to person, place, and time. He has normal strength. No cranial nerve deficit or sensory deficit.  Skin: Skin is warm and dry.  Psychiatric: He has a normal mood and affect. His speech is normal.    ED  Course  Procedures (including critical care time)  Labs Reviewed - No data to display No results found. Pulse Ox 98% on room air. WNL by my interpretation.  No diagnosis found.    MDM  I have reviewed nursing notes, vital signs, and all appropriate lab and imaging results for this patient. Patient has a long-term history of dental issues. He states that 2 days ago he felt as though a portion of the tooth crumbled away. He now has pain on the right and left lower jaw area. Patient states he has been attempting to see a dentist and or an oral surgeon but cannot afford to do to his financial situation. Vital signs are well within normal limits. No visible abscess appreciated at this time. The plan is for the patient to receive Keflex 500 mg 4 times daily and Norco every 4 hours as needed for pain patient also given a prescription for Mobic 7.5 mg daily. Patient encouraged to see a dentist and/or oral surgeon as soon as possible.       Kathie Dike, [place] 08/01/12 [phone]

## 2012-08-01 NOTE — ED Notes (Signed)
Pt has chronic dental pain, this episode started x2 days, rt and lt lower jaw pain.

## 2012-08-05 NOTE — ED Provider Notes (Signed)
Medical screening examination/treatment/procedure(s) were performed by non-physician practitioner and as supervising physician I was immediately available for consultation/collaboration.  Nicoletta Dress. Colon Branch, MD 08/05/12 [phone]

## 2012-08-06 ENCOUNTER — Encounter (HOSPITAL_COMMUNITY): Payer: Self-pay | Admitting: *Deleted

## 2012-08-06 ENCOUNTER — Emergency Department (HOSPITAL_COMMUNITY)
Admission: EM | Admit: 2012-08-06 | Discharge: 2012-08-06 | Disposition: A | Discharge status: Home / Self Care | Payer: Self-pay | Attending: Emergency Medicine | Admitting: Emergency Medicine

## 2012-08-06 DIAGNOSIS — F17200 Nicotine dependence, unspecified, uncomplicated: Secondary | ICD-10-CM | POA: Insufficient documentation

## 2012-08-06 DIAGNOSIS — R197 Diarrhea, unspecified: Secondary | ICD-10-CM | POA: Insufficient documentation

## 2012-08-06 DIAGNOSIS — K529 Noninfective gastroenteritis and colitis, unspecified: Secondary | ICD-10-CM

## 2012-08-06 DIAGNOSIS — R6883 Chills (without fever): Secondary | ICD-10-CM | POA: Insufficient documentation

## 2012-08-06 DIAGNOSIS — K5289 Other specified noninfective gastroenteritis and colitis: Secondary | ICD-10-CM | POA: Insufficient documentation

## 2012-08-06 LAB — CBC WITH DIFFERENTIAL/PLATELET
Basophils Absolute: 0 10*3/uL (ref 0.0–0.1)
Eosinophils Relative: 1 % (ref 0–5)
Lymphocytes Relative: 3 % — ABNORMAL LOW (ref 12–46)
MCV: 89.7 fL (ref 78.0–100.0)
Neutro Abs: 16 10*3/uL — ABNORMAL HIGH (ref 1.7–7.7)
Neutrophils Relative %: 90 % — ABNORMAL HIGH (ref 43–77)
Platelets: 247 10*3/uL (ref 150–400)
RBC: 5.91 MIL/uL — ABNORMAL HIGH (ref 4.22–5.81)
RDW: 13.2 % (ref 11.5–15.5)
WBC: 17.5 10*3/uL — ABNORMAL HIGH (ref 4.0–10.5)

## 2012-08-06 LAB — COMPREHENSIVE METABOLIC PANEL
ALT: 13 U/L (ref 0–53)
AST: 17 U/L (ref 0–37)
Alkaline Phosphatase: 76 U/L (ref 39–117)
CO2: 31 mEq/L (ref 19–32)
Calcium: 9.6 mg/dL (ref 8.4–10.5)
GFR calc non Af Amer: >90 mL/min (ref >90)
Potassium: 3.9 mEq/L (ref 3.5–5.1)
Sodium: 138 mEq/L (ref 135–145)

## 2012-08-06 MED ORDER — SODIUM CHLORIDE 0.9 % IV BOLUS (SEPSIS)
1000.0000 mL | Freq: Once | INTRAVENOUS | Status: AC
  Administered 2012-08-06: 1000 mL via INTRAVENOUS

## 2012-08-06 MED ORDER — ONDANSETRON HCL 4 MG/2ML IJ SOLN
4.0000 mg | Freq: Once | INTRAMUSCULAR | Status: AC
  Administered 2012-08-06: 4 mg via INTRAVENOUS
  Filled 2012-08-06: qty 2

## 2012-08-06 MED ORDER — PROMETHAZINE HCL 25 MG PO TABS: 25.0000 mg | ORAL_TABLET | Freq: Four times a day (QID) | ORAL | Status: DC | PRN

## 2012-08-06 MED ORDER — KETOROLAC TROMETHAMINE 30 MG/ML IJ SOLN
30.0000 mg | Freq: Once | INTRAMUSCULAR | Status: AC
  Administered 2012-08-06: 30 mg via INTRAVENOUS
  Filled 2012-08-06: qty 1

## 2012-08-06 NOTE — ED Notes (Signed)
Discharge instructions reviewed with pt, questions answered. Pt verbalized understanding.  

## 2012-08-06 NOTE — ED Provider Notes (Signed)
History     CSN: [id number]  Arrival date & time 08/06/12  0129   First MD Initiated Contact with Patient 08/06/12 0148      Chief Complaint  Patient presents with  . Vomiting  . Diarrhea    (Consider location/radiation/quality/duration/timing/severity/associated sxs/prior treatment) HPI Comments: The patient presents with n/v/d since yesterday that has persisted.  He has not kept much down po.  No fevers but has felt chilled.  No ill contacts or suspect foods.    Patient is a 27 y.o. male presenting with diarrhea. The history is provided by the patient.  Diarrhea The primary symptoms include nausea, vomiting and diarrhea. Episode onset: 24 hours ago. The onset was gradual. The problem has been gradually worsening.  The illness is also significant for chills.    History reviewed. No pertinent past medical history.  History reviewed. No pertinent past surgical history.  History reviewed. No pertinent family history.  History  Substance Use Topics  . Smoking status: Current Every Day Smoker -- 1.5 packs/day    Types: Cigarettes  . Smokeless tobacco: Never Used  . Alcohol Use: No      Review of Systems  Constitutional: Positive for chills.  Gastrointestinal: Positive for nausea, vomiting and diarrhea.  All other systems reviewed and are negative.    Allergies  Bactrim and Penicillins  Home Medications   Current Outpatient Rx  Name  Route  Sig  Dispense  Refill  . CEPHALEXIN 500 MG PO CAPS   Oral   Take 1 capsule (500 mg total) by mouth 4 (four) times daily.   28 capsule   0   . CEPHALEXIN 500 MG PO CAPS   Oral   Take 1 capsule (500 mg total) by mouth 4 (four) times daily.   20 capsule   0   . HYDROCODONE-ACETAMINOPHEN 5-325 MG PO TABS   Oral   Take 1 tablet by mouth daily as needed. For pain (#3 tablets remaining from previous rx)         . HYDROCODONE-ACETAMINOPHEN 5-325 MG PO TABS   Oral   Take 1 tablet by mouth every 4 (four) hours as needed  for pain.   15 tablet   0   . IBUPROFEN 200 MG PO TABS   Oral   Take 800 mg by mouth every 4 (four) hours as needed. For pain         . MELOXICAM 7.5 MG PO TABS      1 po bid with food   12 tablet   0     BP 122/78  Pulse 110  Temp 99.3 F (37.4 C) (Oral)  Resp 16  Ht 6' (1.829 m)  Wt 155 lb (70.308 kg)  BMI 21.02 kg/m2  SpO2 93%  Physical Exam  Vitals reviewed. Constitutional: He is oriented to person, place, and time. He appears well-developed and well-nourished. No distress.  HENT:  Head: Normocephalic and atraumatic.  Mouth/Throat: Oropharynx is clear and moist.  Neck: Normal range of motion. Neck supple.  Cardiovascular: Normal rate and regular rhythm.   No murmur heard. Pulmonary/Chest: Effort normal and breath sounds normal. No respiratory distress. He has no wheezes.  Abdominal: Soft. Bowel sounds are normal. He exhibits no distension. There is no tenderness.  Musculoskeletal: Normal range of motion. He exhibits no edema.  Lymphadenopathy:    He has no cervical adenopathy.  Neurological: He is alert and oriented to person, place, and time.  Skin: Skin is warm and dry. He is  not diaphoretic.    ED Course  Procedures (including critical care time)  Labs Reviewed - No data to display No results found.   No diagnosis found.    MDM  Presentation, exam, and workup consistent with gastroenteritis.  He was hydrated and is feeling somewhat better.  He was re-examined and there is no RLQ ttp and abd exam remains benign.  The wbc is in my opinion related to the viral illness.  Will discharge with phenergan, return prn.        Geoffery Lyons, MD 08/06/12 [phone]

## 2012-08-06 NOTE — ED Notes (Signed)
Pt c/o nvd since yesterday am. Pt also c/o generalized body aches.

## 2012-12-25 ENCOUNTER — Encounter (HOSPITAL_COMMUNITY): Payer: Self-pay | Admitting: Emergency Medicine

## 2012-12-25 ENCOUNTER — Emergency Department (HOSPITAL_COMMUNITY)
Admission: EM | Admit: 2012-12-25 | Discharge: 2012-12-25 | Disposition: A | Discharge status: Home / Self Care | Payer: Self-pay | Attending: Emergency Medicine | Admitting: Emergency Medicine

## 2012-12-25 DIAGNOSIS — R51 Headache: Secondary | ICD-10-CM | POA: Insufficient documentation

## 2012-12-25 DIAGNOSIS — K089 Disorder of teeth and supporting structures, unspecified: Secondary | ICD-10-CM | POA: Insufficient documentation

## 2012-12-25 DIAGNOSIS — Z79899 Other long term (current) drug therapy: Secondary | ICD-10-CM | POA: Insufficient documentation

## 2012-12-25 DIAGNOSIS — Z880 Allergy status to penicillin: Secondary | ICD-10-CM | POA: Insufficient documentation

## 2012-12-25 DIAGNOSIS — J3489 Other specified disorders of nose and nasal sinuses: Secondary | ICD-10-CM | POA: Insufficient documentation

## 2012-12-25 DIAGNOSIS — F17200 Nicotine dependence, unspecified, uncomplicated: Secondary | ICD-10-CM | POA: Insufficient documentation

## 2012-12-25 DIAGNOSIS — K0889 Other specified disorders of teeth and supporting structures: Secondary | ICD-10-CM

## 2012-12-25 MED ORDER — TRAMADOL HCL 50 MG PO TABS: 50.0000 mg | ORAL_TABLET | Freq: Four times a day (QID) | ORAL | Status: DC | PRN

## 2012-12-25 MED ORDER — HYDROCODONE-ACETAMINOPHEN 5-325 MG PO TABS
2.0000 | ORAL_TABLET | Freq: Once | ORAL | Status: AC
  Administered 2012-12-25: 2 via ORAL
  Filled 2012-12-25: qty 2

## 2012-12-25 MED ORDER — CEPHALEXIN 500 MG PO CAPS: 500.0000 mg | ORAL_CAPSULE | Freq: Four times a day (QID) | ORAL | Status: DC

## 2012-12-25 NOTE — ED Notes (Signed)
Pt c/o right lower toothache and head congestion x 2 days.

## 2012-12-25 NOTE — ED Provider Notes (Signed)
History     CSN: [id number]  Arrival date & time 12/25/12  1033   First MD Initiated Contact with Patient 12/25/12 1144      Chief Complaint  Patient presents with  . Dental Pain  . Facial Pain    (Consider location/radiation/quality/duration/timing/severity/associated sxs/prior treatment) Patient is a 27 y.o. male presenting with tooth pain. The history is provided by the patient.  Dental Pain Location:  Lower Quality:  Aching and sharp Severity:  Moderate Onset quality:  Sudden Duration:  2 days Timing:  Constant Progression:  Worsening Chronicity:  Recurrent Context: dental caries   Relieved by:  Nothing Worsened by:  Cold food/drink and hot food/drink Ineffective treatments:  NSAIDs Associated symptoms: congestion and gum swelling   Associated symptoms: no difficulty swallowing, no drooling, no facial pain, no facial swelling, no fever, no headaches, no neck pain, no neck swelling, no oral bleeding, no oral lesions and no trismus   Associated symptoms comment:  Nasal congestion Congestion:    Location:  Nasal   Interferes with sleep: no     Interferes with eating/drinking: no   Risk factors: periodontal disease and smoking     History reviewed. No pertinent past medical history.  History reviewed. No pertinent past surgical history.  No family history on file.  History  Substance Use Topics  . Smoking status: Current Every Day Smoker -- 1.50 packs/day    Types: Cigarettes  . Smokeless tobacco: Never Used  . Alcohol Use: No      Review of Systems  Constitutional: Negative for fever and appetite change.  HENT: Positive for congestion, rhinorrhea and dental problem. Negative for sore throat, facial swelling, drooling, mouth sores, trouble swallowing, neck pain and neck stiffness.   Eyes: Negative for pain and visual disturbance.  Respiratory: Negative for cough and shortness of breath.   Cardiovascular: Negative for chest pain.  Gastrointestinal:  Negative for nausea and vomiting.  Skin: Negative for rash.  Neurological: Negative for dizziness, facial asymmetry and headaches.  Hematological: Negative for adenopathy.  All other systems reviewed and are negative.    Allergies  Bactrim and Penicillins  Home Medications   Current Outpatient Rx  Name  Route  Sig  Dispense  Refill  . ibuprofen (ADVIL,MOTRIN) 200 MG tablet   Oral   Take 800 mg by mouth every 4 (four) hours as needed. For pain           BP 124/76  Pulse 92  Temp(Src) 98 F (36.7 C) (Oral)  Resp 16  Ht 5\' 11"  (1.803 m)  Wt 150 lb (68.04 kg)  BMI 20.93 kg/m2  SpO2 99%  Physical Exam  Nursing note and vitals reviewed. Constitutional: He is oriented to person, place, and time. He appears well-developed and well-nourished. No distress.  HENT:  Head: Normocephalic and atraumatic. No trismus in the jaw.  Right Ear: Tympanic membrane and ear canal normal.  Left Ear: Tympanic membrane and ear canal normal.  Nose: Mucosal edema and rhinorrhea present.  Mouth/Throat: Uvula is midline, oropharynx is clear and moist and mucous membranes are normal. Dental caries present. No dental abscesses or edematous.    Dental caries with mild STS of the surrounding gums. No facial swelling, trismus or sublingual abnml.  Neck: Normal range of motion. Neck supple.  Cardiovascular: Normal rate, regular rhythm, normal heart sounds and intact distal pulses.   No murmur heard. Pulmonary/Chest: Effort normal and breath sounds normal. No respiratory distress.  Musculoskeletal: Normal range of motion.  Lymphadenopathy:  He has no cervical adenopathy.  Neurological: He is alert and oriented to person, place, and time. He exhibits normal muscle tone. Coordination normal.  Skin: Skin is warm and dry.    ED Course  Procedures (including critical care time)  Labs Reviewed - No data to display No results found.      MDM    Previous ED vistis reviewed, multiple visits  here for dental complains.  I have given the referral info for the upcoming dental clinic   Patient is non-toxic appearing and stable for discharge at this time    Adama Ferber L. Trisha Mangle, PA-C 12/25/12 1233

## 2012-12-26 NOTE — ED Provider Notes (Signed)
Medical screening examination/treatment/procedure(s) were performed by non-physician practitioner and as supervising physician I was immediately available for consultation/collaboration.  Raeford Razor, MD 12/26/12 1135

## 2012-12-30 ENCOUNTER — Encounter (HOSPITAL_COMMUNITY): Payer: Self-pay | Admitting: *Deleted

## 2012-12-30 ENCOUNTER — Emergency Department (HOSPITAL_COMMUNITY)
Admission: EM | Admit: 2012-12-30 | Discharge: 2012-12-30 | Disposition: A | Discharge status: Home / Self Care | Payer: Self-pay | Attending: Emergency Medicine | Admitting: Emergency Medicine

## 2012-12-30 DIAGNOSIS — K0889 Other specified disorders of teeth and supporting structures: Secondary | ICD-10-CM

## 2012-12-30 DIAGNOSIS — F17200 Nicotine dependence, unspecified, uncomplicated: Secondary | ICD-10-CM | POA: Insufficient documentation

## 2012-12-30 DIAGNOSIS — Z880 Allergy status to penicillin: Secondary | ICD-10-CM | POA: Insufficient documentation

## 2012-12-30 DIAGNOSIS — K089 Disorder of teeth and supporting structures, unspecified: Secondary | ICD-10-CM | POA: Insufficient documentation

## 2012-12-30 MED ORDER — HYDROCODONE-ACETAMINOPHEN 5-325 MG PO TABS
2.0000 | ORAL_TABLET | Freq: Once | ORAL | Status: AC
  Administered 2012-12-30: 2 via ORAL
  Filled 2012-12-30: qty 2

## 2012-12-30 NOTE — ED Notes (Signed)
Pt took vicodin ordered by EDP, before this nurse left the room pt states "if that all they are going to do go ahead & sign me out." pt informed we would sign out when the discharge papers were ready. Pt left the room & walked down the hallway & out the doors to the waiting room.

## 2012-12-30 NOTE — ED Notes (Signed)
Pt handed discharge instructions when he returned for his phone he left in room.

## 2012-12-30 NOTE — ED Provider Notes (Signed)
History     CSN: [id number]  Arrival date & time 12/30/12  0348   First MD Initiated Contact with Patient 12/30/12 0354      Chief Complaint  Patient presents with  . Dental Pain    (Consider location/radiation/quality/duration/timing/severity/associated sxs/prior treatment) HPI HPI Comments: Chad Hill is a 27 y.o. male who presents to the Emergency Department complaining of continued pian to tooth on the left back lower jaw. He did not attempt to go to the clinic offered in [facility]. He states the mobic given for pain does not work. He has not taken the cephalexin.   History reviewed. No pertinent past medical history.  History reviewed. No pertinent past surgical history.  No family history on file.  History  Substance Use Topics  . Smoking status: Current Every Day Smoker -- 1.50 packs/day    Types: Cigarettes  . Smokeless tobacco: Never Used  . Alcohol Use: No      Review of Systems  Constitutional: Negative for fever.       10 Systems reviewed and are negative for acute change except as noted in the HPI.  HENT: Negative for congestion.        Dental pain  Eyes: Negative for discharge and redness.  Respiratory: Negative for cough and shortness of breath.   Cardiovascular: Negative for chest pain.  Gastrointestinal: Negative for vomiting and abdominal pain.  Musculoskeletal: Negative for back pain.  Skin: Negative for rash.  Neurological: Negative for syncope, numbness and headaches.  Psychiatric/Behavioral:       No behavior change.    Allergies  Bactrim and Penicillins  Home Medications   Current Outpatient Rx  Name  Route  Sig  Dispense  Refill  . cephALEXin (KEFLEX) 500 MG capsule   Oral   Take 1 capsule (500 mg total) by mouth 4 (four) times daily.   28 capsule   0   . ibuprofen (ADVIL,MOTRIN) 200 MG tablet   Oral   Take 800 mg by mouth every 4 (four) hours as needed. For pain         . traMADol (ULTRAM) 50 MG tablet    Oral   Take 1 tablet (50 mg total) by mouth every 6 (six) hours as needed for pain.   20 tablet   0     BP 128/80  Pulse 78  Temp(Src) 97.6 F (36.4 C) (Oral)  Resp 16  Ht 6' (1.829 m)  Wt 150 lb (68.04 kg)  BMI 20.34 kg/m2  SpO2 98%  Physical Exam  Nursing note and vitals reviewed. Constitutional: He appears well-developed and well-nourished.  Awake, alert, nontoxic appearance.  HENT:  Head: Normocephalic and atraumatic.  Right Ear: External ear normal.  Left Ear: External ear normal.  Tooth 18 with some gum swelling, no obvious abscess. No trismus, facial swelling  Eyes: Right eye exhibits no discharge. Left eye exhibits no discharge.  Neck: Neck supple.  Cardiovascular: Normal rate and intact distal pulses.   Pulmonary/Chest: Effort normal and breath sounds normal. He exhibits no tenderness.  Abdominal: Soft. There is no tenderness. There is no rebound.  Musculoskeletal: He exhibits no tenderness.  Baseline ROM, no obvious new focal weakness.  Neurological:  Mental status and motor strength appears baseline for patient and situation.  Skin: No rash noted.  Psychiatric: He has a normal mood and affect.    ED Course  Procedures (including critical care time)      MDM  Patient with continued tooth pain. States  mobic does not work for pain. Advised patient he needed to fill his prescription for the antibiotic. Given hydrocodone while here. He will not be given a prescription for pain. Pt stable in ED with no significant deterioration in condition.The patient appears reasonably screened and/or stabilized for discharge and I doubt any other medical condition or other [facility] requiring further screening, evaluation, or treatment in the ED at this time prior to discharge.  MDM Reviewed: nursing note and vitals           Nicoletta Dress. Colon Branch, MD 12/30/12 9562

## 2012-12-30 NOTE — ED Notes (Signed)
Pt seen & tx on 5/27. Pt states medicine not working. Pt has no dentist.

## 2013-02-15 ENCOUNTER — Encounter (HOSPITAL_COMMUNITY): Payer: Self-pay | Admitting: Emergency Medicine

## 2013-02-15 ENCOUNTER — Emergency Department (HOSPITAL_COMMUNITY)
Admission: EM | Admit: 2013-02-15 | Discharge: 2013-02-15 | Disposition: A | Discharge status: Home / Self Care | Payer: Self-pay | Attending: Emergency Medicine | Admitting: Emergency Medicine

## 2013-02-15 DIAGNOSIS — F17200 Nicotine dependence, unspecified, uncomplicated: Secondary | ICD-10-CM | POA: Insufficient documentation

## 2013-02-15 DIAGNOSIS — K029 Dental caries, unspecified: Secondary | ICD-10-CM | POA: Insufficient documentation

## 2013-02-15 DIAGNOSIS — R51 Headache: Secondary | ICD-10-CM | POA: Insufficient documentation

## 2013-02-15 DIAGNOSIS — Z79899 Other long term (current) drug therapy: Secondary | ICD-10-CM | POA: Insufficient documentation

## 2013-02-15 DIAGNOSIS — Z880 Allergy status to penicillin: Secondary | ICD-10-CM | POA: Insufficient documentation

## 2013-02-15 MED ORDER — CEPHALEXIN 500 MG PO CAPS: 500.0000 mg | ORAL_CAPSULE | Freq: Four times a day (QID) | ORAL | Status: DC

## 2013-02-15 MED ORDER — TRAMADOL HCL 50 MG PO TABS: 50.0000 mg | ORAL_TABLET | Freq: Four times a day (QID) | ORAL | Status: DC | PRN

## 2013-02-15 NOTE — ED Notes (Signed)
Pt c/o L upper jaw dental pain, onset Thurs.

## 2013-02-15 NOTE — ED Provider Notes (Signed)
History    CSN: [id number] Arrival date & time 02/15/13  1227  First MD Initiated Contact with Patient 02/15/13 1329     Chief Complaint  Patient presents with  . Dental Pain   (Consider location/radiation/quality/duration/timing/severity/associated sxs/prior Treatment) Patient is a 27 y.o. male presenting with tooth pain. The history is provided by the patient.  Dental Pain Location:  Upper Upper teeth location:  10/LU lateral incisor Quality:  Aching Severity:  Moderate Onset quality:  Gradual Duration:  2 days Timing:  Constant Progression:  Worsening Chronicity:  Chronic Context: dental caries and poor dentition   Relieved by:  Nothing Ineffective treatments:  Acetaminophen Associated symptoms: gum swelling and headaches   Associated symptoms: no neck pain    History reviewed. No pertinent past medical history. History reviewed. No pertinent past surgical history. History reviewed. No pertinent family history. History  Substance Use Topics  . Smoking status: Current Every Day Smoker -- 1.50 packs/day for 10 years    Types: Cigarettes  . Smokeless tobacco: Never Used  . Alcohol Use: No    Review of Systems  Constitutional: Negative for activity change.       All ROS Neg except as noted in HPI  HENT: Positive for dental problem. Negative for nosebleeds and neck pain.   Eyes: Negative for photophobia and discharge.  Respiratory: Negative for cough, shortness of breath and wheezing.   Cardiovascular: Negative for chest pain and palpitations.  Gastrointestinal: Negative for abdominal pain and blood in stool.  Genitourinary: Negative for dysuria, frequency and hematuria.  Musculoskeletal: Negative for back pain and arthralgias.  Skin: Negative.   Neurological: Positive for headaches. Negative for dizziness, seizures and speech difficulty.  Psychiatric/Behavioral: Negative for hallucinations and confusion.    Allergies  Bactrim and Penicillins  Home  Medications   Current Outpatient Rx  Name  Route  Sig  Dispense  Refill  . acetaminophen (TYLENOL) 500 MG tablet   Oral   Take 1,000 mg by mouth every 6 (six) hours as needed for pain.         . Aspirin-Salicylamide-Caffeine (BC HEADACHE POWDER PO)   Oral   Take 1 packet by mouth daily as needed.         . cephALEXin (KEFLEX) 500 MG capsule   Oral   Take 1 capsule (500 mg total) by mouth 4 (four) times daily.   28 capsule   0   . traMADol (ULTRAM) 50 MG tablet   Oral   Take 1 tablet (50 mg total) by mouth every 6 (six) hours as needed for pain.   20 tablet   0    BP 112/67  Pulse 71  Temp(Src) 98.4 F (36.9 C) (Oral)  Resp 16  Ht 6' (1.829 m)  Wt 155 lb (70.308 kg)  BMI 21.02 kg/m2  SpO2 99% Physical Exam  Nursing note and vitals reviewed. Constitutional: He is oriented to person, place, and time. He appears well-developed and well-nourished.  Non-toxic appearance.  HENT:  Head: Normocephalic.  Right Ear: Tympanic membrane and external ear normal.  Left Ear: Tympanic membrane and external ear normal.  Multiple cavities noted. Swelling of the gum of the left upper jaw.  No drainage. Airway patent. No swelling under the tongue.  Eyes: EOM and lids are normal. Pupils are equal, round, and reactive to light.  Neck: Normal range of motion. Neck supple. Carotid bruit is not present.  Cardiovascular: Normal rate, regular rhythm, normal heart sounds, intact distal pulses and normal pulses.  Pulmonary/Chest: Breath sounds normal. No respiratory distress.  Abdominal: Soft. Bowel sounds are normal. There is no tenderness. There is no guarding.  Musculoskeletal: Normal range of motion.  Lymphadenopathy:       Head (right side): No submandibular adenopathy present.       Head (left side): No submandibular adenopathy present.    He has no cervical adenopathy.  Neurological: He is alert and oriented to person, place, and time. He has normal strength. No cranial nerve deficit  or sensory deficit.  Skin: Skin is warm and dry.  Psychiatric: He has a normal mood and affect. His speech is normal.    ED Course  Procedures (including critical care time) Labs Reviewed - No data to display No results found. No diagnosis found.  MDM  *I have reviewed nursing notes, vital signs, and all appropriate lab and imaging results for this patient.** Dental resource guides given to the patient. Rx for keflex and tramadol given to the patient. No fever or chills noted on todays exam. No signs of Ludwig's Angina.  Kathie Dike, PA-C 02/22/13 1654

## 2013-03-02 NOTE — ED Provider Notes (Signed)
Medical screening examination/treatment/procedure(s) were performed by non-physician practitioner and as supervising physician I was immediately available for consultation/collaboration.   Bryahna Lesko W. Weltha Cathy, MD 03/02/13 2357 

## 2013-10-16 ENCOUNTER — Emergency Department (HOSPITAL_COMMUNITY)
Admission: EM | Admit: 2013-10-16 | Discharge: 2013-10-16 | Discharge status: Against Medical Advice | Payer: Self-pay | Attending: Emergency Medicine | Admitting: Emergency Medicine

## 2013-10-16 ENCOUNTER — Encounter (HOSPITAL_COMMUNITY): Payer: Self-pay | Admitting: Emergency Medicine

## 2013-10-16 DIAGNOSIS — R109 Unspecified abdominal pain: Secondary | ICD-10-CM | POA: Insufficient documentation

## 2013-10-16 DIAGNOSIS — F17200 Nicotine dependence, unspecified, uncomplicated: Secondary | ICD-10-CM | POA: Insufficient documentation

## 2013-10-16 NOTE — ED Notes (Signed)
Patient LWBS after triage; no answer x 3 for room placement.

## 2013-10-16 NOTE — ED Notes (Signed)
No answer x 1 for room placement

## 2013-10-16 NOTE — ED Notes (Signed)
abd pain, no NVD, no fever.

## 2013-10-16 NOTE — ED Notes (Signed)
No answer x 2 for room placement

## 2013-10-16 NOTE — ED Notes (Signed)
No answer x 3 for room placment.

## 2013-11-18 ENCOUNTER — Encounter (HOSPITAL_COMMUNITY): Payer: Self-pay | Admitting: Emergency Medicine

## 2013-11-18 ENCOUNTER — Emergency Department (HOSPITAL_COMMUNITY)
Admission: EM | Admit: 2013-11-18 | Discharge: 2013-11-19 | Disposition: A | Discharge status: Home / Self Care | Payer: Self-pay | Attending: Emergency Medicine | Admitting: Emergency Medicine

## 2013-11-18 DIAGNOSIS — Z7982 Long term (current) use of aspirin: Secondary | ICD-10-CM | POA: Insufficient documentation

## 2013-11-18 DIAGNOSIS — F17200 Nicotine dependence, unspecified, uncomplicated: Secondary | ICD-10-CM | POA: Insufficient documentation

## 2013-11-18 DIAGNOSIS — Z880 Allergy status to penicillin: Secondary | ICD-10-CM | POA: Insufficient documentation

## 2013-11-18 DIAGNOSIS — Z792 Long term (current) use of antibiotics: Secondary | ICD-10-CM | POA: Insufficient documentation

## 2013-11-18 DIAGNOSIS — R300 Dysuria: Secondary | ICD-10-CM | POA: Insufficient documentation

## 2013-11-18 LAB — URINALYSIS, ROUTINE W REFLEX MICROSCOPIC
GLUCOSE, UA: NEGATIVE mg/dL
LEUKOCYTES UA: NEGATIVE
NITRITE: NEGATIVE
PROTEIN: 30 mg/dL — AB
Specific Gravity, Urine: >1.03 — ABNORMAL HIGH (ref 1.005–1.030)
Urobilinogen, UA: 0.2 mg/dL (ref 0.0–1.0)
pH: 6 (ref 5.0–8.0)

## 2013-11-18 LAB — URINE MICROSCOPIC-ADD ON

## 2013-11-18 NOTE — ED Notes (Signed)
Patient reports trouble urinating x 3 days.

## 2013-11-19 ENCOUNTER — Encounter (HOSPITAL_COMMUNITY): Payer: Self-pay | Admitting: Radiology

## 2013-11-19 ENCOUNTER — Emergency Department (HOSPITAL_COMMUNITY): Payer: Self-pay

## 2013-11-19 MED ORDER — PHENAZOPYRIDINE HCL 100 MG PO TABS
200.0000 mg | ORAL_TABLET | Freq: Once | ORAL | Status: AC
  Administered 2013-11-19: 200 mg via ORAL
  Filled 2013-11-19: qty 2

## 2013-11-19 MED ORDER — PHENAZOPYRIDINE HCL 200 MG PO TABS: 200.0000 mg | ORAL_TABLET | Freq: Three times a day (TID) | ORAL | Status: DC

## 2013-11-19 NOTE — ED Provider Notes (Signed)
Medical screening examination/treatment/procedure(s) were performed by non-physician practitioner and as supervising physician I was immediately available for consultation/collaboration.     Geoffery Lyonsouglas Vega [place], MD 11/19/13 [phone]

## 2013-11-19 NOTE — ED Notes (Signed)
Patient states that he still feels like his bladder is still full and that he needs to void. RN Victorino DikeJennifer made aware.

## 2013-11-19 NOTE — ED Provider Notes (Signed)
CSN: [id number]     Arrival date & time 11/18/13  2111 History   First MD Initiated Contact with Patient 11/18/13 2318     Chief Complaint  Patient presents with  . Urinary Retention     (Consider location/radiation/quality/duration/timing/severity/associated sxs/prior Treatment) HPI Comments: Chad Hill is a 28 y.o. male who presents to the Emergency Department complaining of gradual onset of urinary retention for 3 days.  He states that he is able to urinate, but states that it does not feel like he empties his bladder completely.  He reports a "normal" stream of urine, but urine appears dark in color.  He denies fever, chills, vomiting,  penile discharge, hematuria, abdominal or back pain, testicular pain or swelling or genital lesions.  Patient also denies hx of kidney stones or UTI's.  Nothing makes the symptoms better or worse.    The history is provided by the patient.    History reviewed. No pertinent past medical history. History reviewed. No pertinent past surgical history. History reviewed. No pertinent family history. History  Substance Use Topics  . Smoking status: Current Every Day Smoker -- 1.50 packs/day for 10 years    Types: Cigarettes  . Smokeless tobacco: Never Used  . Alcohol Use: No    Review of Systems  Constitutional: Negative for fever, chills, activity change and appetite change.  Respiratory: Negative for chest tightness and shortness of breath.   Cardiovascular: Negative for chest pain.  Gastrointestinal: Negative for nausea, vomiting, abdominal pain, diarrhea and abdominal distention.  Genitourinary: Positive for difficulty urinating. Negative for dysuria, urgency, frequency, hematuria, flank pain, discharge, penile swelling, scrotal swelling, genital sores, penile pain and testicular pain.  Musculoskeletal: Negative for back pain.  Skin: Negative for rash.  All other systems reviewed and are negative.     Allergies  Bactrim and  Penicillins  Home Medications   Prior to Admission medications   Medication Sig Start Date End Date Taking? Authorizing Provider  acetaminophen (TYLENOL) 500 MG tablet Take 1,000 mg by mouth every 6 (six) hours as needed for pain.    Historical Provider, MD  Aspirin-Salicylamide-Caffeine (BC HEADACHE POWDER PO) Take 1 packet by mouth daily as needed.    Historical Provider, MD  cephALEXin (KEFLEX) 500 MG capsule Take 1 capsule (500 mg total) by mouth 4 (four) times daily. 12/25/12   Khailee Mick L. Nasteho Glantz, PA-C  cephALEXin (KEFLEX) 500 MG capsule Take 1 capsule (500 mg total) by mouth 4 (four) times daily. 02/15/13   Kathie DikeHobson M Bryant, PA-C  traMADol (ULTRAM) 50 MG tablet Take 1 tablet (50 mg total) by mouth every 6 (six) hours as needed for pain. 12/25/12   Cameran Pettey L. Jaecion Dempster, PA-C  traMADol (ULTRAM) 50 MG tablet Take 1 tablet (50 mg total) by mouth every 6 (six) hours as needed for pain. 02/15/13   Kathie DikeHobson M Bryant, PA-C   BP 130/79  Pulse 96  Temp(Src) 98.1 F (36.7 C) (Oral)  Resp 18  Ht 6' (1.829 m)  Wt 145 lb (65.772 kg)  BMI 19.66 kg/m2  SpO2 100% Physical Exam  Nursing note and vitals reviewed. Constitutional: He is oriented to person, place, and time. He appears well-developed and well-nourished. No distress.  HENT:  Head: Normocephalic and atraumatic.  Mouth/Throat: Oropharynx is clear and moist.  Cardiovascular: Normal rate, regular rhythm, normal heart sounds and intact distal pulses.   No murmur heard. Pulmonary/Chest: Effort normal and breath sounds normal. No respiratory distress. He exhibits no tenderness.  Abdominal: Soft. He exhibits no  distension and no mass. There is no hepatosplenomegaly. There is no tenderness. There is no rigidity, no rebound, no guarding, no CVA tenderness and no tenderness at McBurney's point.  Musculoskeletal: Normal range of motion.  Neurological: He is alert and oriented to person, place, and time. He exhibits normal muscle tone. Coordination normal.   Skin: Skin is warm and dry.    ED Course  Procedures (including critical care time) Labs Review Labs Reviewed  URINALYSIS, ROUTINE W REFLEX MICROSCOPIC - Abnormal; Notable for the following:    Specific Gravity, Urine >1.030 (*)    Hgb urine dipstick SMALL (*)    Bilirubin Urine SMALL (*)    Ketones, ur TRACE (*)    Protein, ur 30 (*)    All other components within normal limits  URINE MICROSCOPIC-ADD ON - Abnormal; Notable for the following:    Crystals CA OXALATE CRYSTALS (*)    All other components within normal limits    Imaging Review Ct Abdomen Pelvis Wo Contrast  11/19/2013   CLINICAL DATA:  Urinary retention.  EXAM: CT ABDOMEN AND PELVIS WITHOUT CONTRAST  TECHNIQUE: Multidetector CT imaging of the abdomen and pelvis was performed following the standard protocol without intravenous contrast.  COMPARISON:  None.  FINDINGS: Included view of the lung bases are clear. The visualized heart and pericardium are unremarkable.  Kidneys are orthotopic, demonstrating normal size and morphology. 3 mm left interpolar, punctate right lower and interpolar renal calculi. No hydronephrosis; limited assessment for renal masses on this nonenhanced examination. The unopacified ureters are normal in course and caliber. Urinary bladder is partially distended with very mild circumferential bladder wall thickening.  The liver, spleen, gallbladder, pancreas and adrenal glands are unremarkable for this non-contrast examination.  The stomach, small and large bowel are normal in course and caliber without inflammatory changes, the sensitivity may be decreased by lack of enteric contrast. The appendix is not discretely identified, however there are no inflammatory changes in the right lower quadrant. No intraperitoneal free fluid nor free air.  Aortoiliac vessels are normal in course and caliber with trace calcific atherosclerosis. No lymphadenopathy by CT size criteria. Internal reproductive organs are  unremarkable. The soft tissues and included osseous structures are nonsuspicious.  IMPRESSION: Bilateral nonobstructing urolithiasis measuring up to 3 mm on the left. Mild urinary bladder wall thickening could reflect underdistention or possibly cystitis without bladder stone.   Electronically Signed   By: Awilda Metroourtnay  Bloomer   On: 11/19/2013 00:42     EKG Interpretation None     Urine culture pending MDM   Final diagnoses:  Dysuria    Patient is sleeping upon entering the exam room.  Appears comfortable.  Abdomen is soft  NT, no CVA tenderness   Pt hx and care plan was discussed with Dr. Judd Lienelo   Post void residual was 159 cc.  No acute findings on CT to explain patient's sx's.  No concerning sx's for infectious process.  I have advised pt that if urine culture is positive, that he will be notified and abx would be started.  Patient is well appearing and stable for discharge.  He agrees to increase fluids, pyridium and referral to urology.      Fredie Majano L. Felisia Balcom, PA-C 11/19/13 0126

## 2013-11-19 NOTE — Discharge Instructions (Signed)

## 2013-11-20 LAB — URINE CULTURE
COLONY COUNT: NO GROWTH
CULTURE: NO GROWTH

## 2014-07-24 ENCOUNTER — Encounter (HOSPITAL_COMMUNITY): Payer: Self-pay | Admitting: Emergency Medicine

## 2014-07-24 ENCOUNTER — Emergency Department (HOSPITAL_COMMUNITY): Payer: Self-pay

## 2014-07-24 ENCOUNTER — Emergency Department (HOSPITAL_COMMUNITY)
Admission: EM | Admit: 2014-07-24 | Discharge: 2014-07-24 | Disposition: A | Discharge status: Home / Self Care | Payer: Self-pay | Attending: Emergency Medicine | Admitting: Emergency Medicine

## 2014-07-24 DIAGNOSIS — Z79899 Other long term (current) drug therapy: Secondary | ICD-10-CM | POA: Insufficient documentation

## 2014-07-24 DIAGNOSIS — R05 Cough: Secondary | ICD-10-CM

## 2014-07-24 DIAGNOSIS — J189 Pneumonia, unspecified organism: Secondary | ICD-10-CM

## 2014-07-24 DIAGNOSIS — J159 Unspecified bacterial pneumonia: Secondary | ICD-10-CM | POA: Insufficient documentation

## 2014-07-24 DIAGNOSIS — Z880 Allergy status to penicillin: Secondary | ICD-10-CM | POA: Insufficient documentation

## 2014-07-24 DIAGNOSIS — R059 Cough, unspecified: Secondary | ICD-10-CM

## 2014-07-24 DIAGNOSIS — Z720 Tobacco use: Secondary | ICD-10-CM | POA: Insufficient documentation

## 2014-07-24 MED ORDER — BENZONATATE 100 MG PO CAPS: 100.0000 mg | ORAL_CAPSULE | Freq: Three times a day (TID) | ORAL | Status: DC

## 2014-07-24 MED ORDER — SODIUM CHLORIDE 0.9 % IV BOLUS (SEPSIS)
1000.0000 mL | Freq: Once | INTRAVENOUS | Status: AC
  Administered 2014-07-24: 1000 mL via INTRAVENOUS

## 2014-07-24 MED ORDER — LEVOFLOXACIN 750 MG PO TABS: 750.0000 mg | ORAL_TABLET | Freq: Every day | ORAL | Status: DC

## 2014-07-24 MED ORDER — IBUPROFEN 600 MG PO TABS: 600.0000 mg | ORAL_TABLET | Freq: Four times a day (QID) | ORAL | Status: DC | PRN

## 2014-07-24 MED ORDER — LEVOFLOXACIN IN D5W 750 MG/150ML IV SOLN
750.0000 mg | Freq: Once | INTRAVENOUS | Status: AC
  Administered 2014-07-24: 750 mg via INTRAVENOUS
  Filled 2014-07-24: qty 150

## 2014-07-24 MED ORDER — KETOROLAC TROMETHAMINE 30 MG/ML IJ SOLN
30.0000 mg | Freq: Once | INTRAMUSCULAR | Status: AC
  Administered 2014-07-24: 30 mg via INTRAVENOUS
  Filled 2014-07-24: qty 1

## 2014-07-24 NOTE — ED Provider Notes (Signed)
CSN: [id number]     Arrival date & time 07/24/14  [id number] History   First MD Initiated Contact with Patient 07/24/14 0409     Chief Complaint  Patient presents with  . Cough     (Consider location/radiation/quality/duration/timing/severity/associated sxs/prior Treatment) HPI Patient presents with cough productive of yellow sputum, generalized body aches and diaphoresis for the past few days. Denies chest pain. Denies nasal congestion or sore throat. No known sick contacts. Similar illness earlier in the month that improved. History reviewed. No pertinent past medical history. History reviewed. No pertinent past surgical history. No family history on file. History  Substance Use Topics  . Smoking status: Current Every Day Smoker -- 1.00 packs/day for 10 years    Types: Cigarettes  . Smokeless tobacco: Never Used  . Alcohol Use: No    Review of Systems  Constitutional: Positive for fever, chills and fatigue.  HENT: Negative for congestion and sore throat.   Respiratory: Positive for cough. Negative for shortness of breath and wheezing.   Cardiovascular: Negative for chest pain, palpitations and leg swelling.  Gastrointestinal: Negative for vomiting and abdominal pain.  Genitourinary: Negative for dysuria and flank pain.  Musculoskeletal: Positive for myalgias. Negative for back pain, neck pain and neck stiffness.  Skin: Negative for rash and wound.  Neurological: Negative for dizziness, weakness, light-headedness, numbness and headaches.  All other systems reviewed and are negative.     Allergies  Bactrim and Penicillins  Home Medications   Prior to Admission medications   Medication Sig Start Date End Date Taking? Authorizing Provider  Phenyleph-Doxylamine-DM-APAP (NYQUIL SEVERE COLD/FLU) 5-6.25-10-325 MG/15ML LIQD Take 30 mLs by mouth as needed (for cold).   Yes Historical Provider, MD  cephALEXin (KEFLEX) 500 MG capsule Take 1 capsule (500 mg total) by mouth 4 (four)  times daily. 12/25/12   Tammy L. Triplett, PA-C  cephALEXin (KEFLEX) 500 MG capsule Take 1 capsule (500 mg total) by mouth 4 (four) times daily. 02/15/13   Kathie DikeHobson M Bryant, PA-C  phenazopyridine (PYRIDIUM) 200 MG tablet Take 1 tablet (200 mg total) by mouth 3 (three) times daily. 11/19/13   Tammy L. Triplett, PA-C  traMADol (ULTRAM) 50 MG tablet Take 1 tablet (50 mg total) by mouth every 6 (six) hours as needed for pain. 12/25/12   Tammy L. Triplett, PA-C  traMADol (ULTRAM) 50 MG tablet Take 1 tablet (50 mg total) by mouth every 6 (six) hours as needed for pain. 02/15/13   Kathie DikeHobson M Bryant, PA-C   BP 128/72 mmHg  Pulse 82  Temp(Src) 98.2 F (36.8 C) (Oral)  Resp 18  Ht 6' (1.829 m)  Wt 155 lb (70.308 kg)  BMI 21.02 kg/m2  SpO2 98% Physical Exam  Constitutional: He is oriented to person, place, and time. He appears well-developed and well-nourished. No distress.  HENT:  Head: Normocephalic and atraumatic.  Mouth/Throat: Oropharynx is clear and moist. No oropharyngeal exudate.  No sinus tenderness to percussion.  Eyes: EOM are normal. Pupils are equal, round, and reactive to light.  Neck: Normal range of motion. Neck supple.  No meningismus  Cardiovascular: Normal rate and regular rhythm.   Pulmonary/Chest: Effort normal. No respiratory distress. He has no wheezes. He has rales (scattered bilateral Rales).  Abdominal: Soft. Bowel sounds are normal. He exhibits no distension and no mass. There is no tenderness. There is no rebound and no guarding.  Musculoskeletal: Normal range of motion. He exhibits no edema or tenderness.  Neurological: He is alert and oriented to person, place,  and time.  Skin: Skin is warm and dry. No rash noted. No erythema.  Psychiatric: He has a normal mood and affect. His behavior is normal.  Nursing note and vitals reviewed.   ED Course  Procedures (including critical care time) Labs Review Labs Reviewed - No data to display  Imaging Review Dg Chest 2  View  07/24/2014   CLINICAL DATA:  Acute onset of dry cough and chills for 2 days. Current history of smoking. Initial encounter.  EXAM: CHEST  2 VIEW  COMPARISON:  Chest radiograph performed 09/25/2007  FINDINGS: The lungs are well-aerated. Mild medial right basilar opacity raises concern for pneumonia. There is no evidence of pleural effusion or pneumothorax.  The heart is normal in size; the mediastinal contour is within normal limits. No acute osseous abnormalities are seen.  IMPRESSION: Mild medial right basilar pneumonia.   Electronically Signed   By: Roanna RaiderJeffery  Chang M.D.   On: 07/24/2014 05:06     EKG Interpretation None      MDM   Final diagnoses:  Cough   Patient states he is feeling much better. He is asking to be discharged home prior to completion of IV antibiotic. He's been given return precautions and is voiced understanding.     Loren Raceravid Trenisha Lafavor, MD 07/24/14 [phone]

## 2014-07-24 NOTE — ED Notes (Signed)
Pt arrives with c/o flulike symptoms for the past few days, cough, generalized body aches, night sweats but has not taken his temperature to check for fever. No fever in triage. Upper body aches

## 2014-07-24 NOTE — Discharge Instructions (Signed)

## 2014-07-24 NOTE — ED Notes (Signed)
Pt. Refused wheelchair and left with all belongings 

## 2015-04-08 ENCOUNTER — Encounter (HOSPITAL_COMMUNITY): Payer: Self-pay | Admitting: Emergency Medicine

## 2015-04-08 ENCOUNTER — Emergency Department (HOSPITAL_COMMUNITY)
Admission: EM | Admit: 2015-04-08 | Discharge: 2015-04-08 | Disposition: A | Discharge status: Home / Self Care | Payer: Self-pay | Attending: Emergency Medicine | Admitting: Emergency Medicine

## 2015-04-08 DIAGNOSIS — R21 Rash and other nonspecific skin eruption: Secondary | ICD-10-CM | POA: Insufficient documentation

## 2015-04-08 DIAGNOSIS — Z880 Allergy status to penicillin: Secondary | ICD-10-CM | POA: Insufficient documentation

## 2015-04-08 DIAGNOSIS — Z720 Tobacco use: Secondary | ICD-10-CM | POA: Insufficient documentation

## 2015-04-08 DIAGNOSIS — Z792 Long term (current) use of antibiotics: Secondary | ICD-10-CM | POA: Insufficient documentation

## 2015-04-08 DIAGNOSIS — Z79899 Other long term (current) drug therapy: Secondary | ICD-10-CM | POA: Insufficient documentation

## 2015-04-08 MED ORDER — DOXYCYCLINE HYCLATE 100 MG PO TABS
100.0000 mg | ORAL_TABLET | Freq: Once | ORAL | Status: AC
  Administered 2015-04-08: 100 mg via ORAL
  Filled 2015-04-08: qty 1

## 2015-04-08 MED ORDER — CIPROFLOXACIN HCL 500 MG PO TABS: 500.0000 mg | ORAL_TABLET | Freq: Two times a day (BID) | ORAL | Status: DC

## 2015-04-08 MED ORDER — MUPIROCIN CALCIUM 2 % EX CREA: 1.0000 "application " | TOPICAL_CREAM | Freq: Two times a day (BID) | CUTANEOUS | Status: DC

## 2015-04-08 NOTE — ED Notes (Signed)
PT reports irritation to left axilla with no injury and no new hygiene product use.

## 2015-04-08 NOTE — Discharge Instructions (Signed)
Warm tub soaks daily until rash resolves. Cipro and bactroban daily. Wash hands frequently.

## 2015-04-08 NOTE — ED Provider Notes (Signed)
CSN: [id number]     Arrival date & time 04/08/15  1550 History   First MD Initiated Contact with Patient 04/08/15 1647     Chief Complaint  Patient presents with  . Rash     (Consider location/radiation/quality/duration/timing/severity/associated sxs/prior Treatment) Patient is a 29 y.o. male presenting with rash. The history is provided by the patient.  Rash Location:  Shoulder/arm Shoulder/arm rash location:  L axilla Quality: painful and peeling   Pain details:    Quality:  Sharp   Severity:  Moderate   Onset quality:  Gradual   Duration:  5 days   Timing:  Intermittent   Progression:  Worsening Severity:  Moderate Onset quality:  Gradual Duration:  5 days Timing:  Intermittent Progression:  Worsening Chronicity:  New Context comment:  Unknown Relieved by:  Nothing Ineffective treatments: neosporin. Associated symptoms: no fever, no shortness of breath and not wheezing     History reviewed. No pertinent past medical history. Past Surgical History  Procedure Laterality Date  . Tendon to arm     History reviewed. No pertinent family history. Social History  Substance Use Topics  . Smoking status: Current Every Day Smoker -- 1.00 packs/day for 10 years    Types: Cigarettes  . Smokeless tobacco: Never Used  . Alcohol Use: No    Review of Systems  Constitutional: Negative for fever.  Respiratory: Negative for shortness of breath and wheezing.   Skin: Positive for rash.  All other systems reviewed and are negative.     Allergies  Bactrim and Penicillins  Home Medications   Prior to Admission medications   Medication Sig Start Date End Date Taking? Authorizing Provider  benzonatate (TESSALON) 100 MG capsule Take 1 capsule (100 mg total) by mouth every 8 (eight) hours. 07/24/14   Loren Racer, MD  cephALEXin (KEFLEX) 500 MG capsule Take 1 capsule (500 mg total) by mouth 4 (four) times daily. 12/25/12   Tammy Triplett, PA-C  cephALEXin (KEFLEX) 500 MG  capsule Take 1 capsule (500 mg total) by mouth 4 (four) times daily. 02/15/13   Ivery Quale, PA-C  ibuprofen (ADVIL,MOTRIN) 600 MG tablet Take 1 tablet (600 mg total) by mouth every 6 (six) hours as needed. 07/24/14   Loren Racer, MD  levofloxacin (LEVAQUIN) 750 MG tablet Take 1 tablet (750 mg total) by mouth daily. X 7 days 07/24/14   Loren Racer, MD  phenazopyridine (PYRIDIUM) 200 MG tablet Take 1 tablet (200 mg total) by mouth 3 (three) times daily. 11/19/13   Tammy Triplett, PA-C  Phenyleph-Doxylamine-DM-APAP (NYQUIL SEVERE COLD/FLU) 5-6.25-10-325 MG/15ML LIQD Take 30 mLs by mouth as needed (for cold).    Historical Provider, MD  traMADol (ULTRAM) 50 MG tablet Take 1 tablet (50 mg total) by mouth every 6 (six) hours as needed for pain. 12/25/12   Tammy Triplett, PA-C  traMADol (ULTRAM) 50 MG tablet Take 1 tablet (50 mg total) by mouth every 6 (six) hours as needed for pain. 02/15/13   Ivery Quale, PA-C   BP 128/74 mmHg  Pulse 90  Temp(Src) 97.8 F (36.6 C) (Oral)  Resp 12  Ht 6' (1.829 m)  Wt 150 lb (68.04 kg)  BMI 20.34 kg/m2  SpO2 100% Physical Exam  Constitutional: He is oriented to person, place, and time. He appears well-developed and well-nourished.  Non-toxic appearance.  HENT:  Head: Normocephalic.  Right Ear: Tympanic membrane and external ear normal.  Left Ear: Tympanic membrane and external ear normal.  Eyes: EOM and lids are normal. Pupils  are equal, round, and reactive to light.  Neck: Normal range of motion. Neck supple. Carotid bruit is not present.  Cardiovascular: Normal rate, regular rhythm, normal heart sounds, intact distal pulses and normal pulses.   Pulmonary/Chest: Breath sounds normal. No respiratory distress.  Abdominal: Soft. Bowel sounds are normal. There is no tenderness. There is no guarding.  Musculoskeletal: Normal range of motion.  No palpable lymph nodes appreciated at the bicep tricep area of the right or left. The radial pulses are 2+. There  no red streaks going up the arm.  Lymphadenopathy:       Head (right side): No submandibular adenopathy present.       Head (left side): No submandibular adenopathy present.    He has no cervical adenopathy.  Neurological: He is alert and oriented to person, place, and time. He has normal strength. No cranial nerve deficit or sensory deficit.  Skin: Skin is warm and dry.  There is a denuded area with weeping at the left axilla. There is an area of increased redness and friability at the right axilla.  Psychiatric: He has a normal mood and affect. His speech is normal.  Nursing note and vitals reviewed.   ED Course  Procedures (including critical care time) Labs Review Labs Reviewed - No data to display  Imaging Review No results found. I have personally reviewed and evaluated these images and lab results as part of my medical decision-making.   EKG Interpretation None      MDM  The denuded areas with weeping suggest possible staph infection. The patient will be treated with Bactroban and Cipro. The patient is to see his primary physician, or return to the emergency department if not improving. We have discussed handwashing and signs of advancing infection.    Final diagnoses:  None    **I have reviewed nursing notes, vital signs, and all appropriate lab and imaging results for this patient.Ivery Quale, PA-C 04/08/15 1610  Eber Hong, MD 04/09/15 [phone]

## 2015-10-27 ENCOUNTER — Encounter (HOSPITAL_COMMUNITY): Payer: Self-pay | Admitting: *Deleted

## 2015-10-27 ENCOUNTER — Emergency Department (HOSPITAL_COMMUNITY)
Admission: EM | Admit: 2015-10-27 | Discharge: 2015-10-27 | Disposition: A | Discharge status: Home / Self Care | Payer: Self-pay | Attending: Emergency Medicine | Admitting: Emergency Medicine

## 2015-10-27 DIAGNOSIS — F17210 Nicotine dependence, cigarettes, uncomplicated: Secondary | ICD-10-CM | POA: Insufficient documentation

## 2015-10-27 DIAGNOSIS — K529 Noninfective gastroenteritis and colitis, unspecified: Secondary | ICD-10-CM | POA: Insufficient documentation

## 2015-10-27 LAB — BASIC METABOLIC PANEL
Anion gap: 9 (ref 5–15)
BUN: 16 mg/dL (ref 6–20)
CALCIUM: 9.4 mg/dL (ref 8.9–10.3)
CO2: 26 mmol/L (ref 22–32)
CREATININE: 1.08 mg/dL (ref 0.61–1.24)
Chloride: 101 mmol/L (ref 101–111)
Glucose, Bld: 117 mg/dL — ABNORMAL HIGH (ref 65–99)
Potassium: 4.3 mmol/L (ref 3.5–5.1)
SODIUM: 136 mmol/L (ref 135–145)

## 2015-10-27 LAB — CBC WITH DIFFERENTIAL/PLATELET
BASOS ABS: 0 10*3/uL (ref 0.0–0.1)
BASOS PCT: 0 %
EOS ABS: 0 10*3/uL (ref 0.0–0.7)
Eosinophils Relative: 0 %
HEMATOCRIT: 51.6 % (ref 39.0–52.0)
HEMOGLOBIN: 18.1 g/dL — AB (ref 13.0–17.0)
Lymphocytes Relative: 4 %
Lymphs Abs: 0.7 10*3/uL (ref 0.7–4.0)
MCH: 31.4 pg (ref 26.0–34.0)
MCHC: 35.1 g/dL (ref 30.0–36.0)
MCV: 89.6 fL (ref 78.0–100.0)
MONO ABS: 0.9 10*3/uL (ref 0.1–1.0)
Monocytes Relative: 5 %
NEUTROS ABS: 17.5 10*3/uL — AB (ref 1.7–7.7)
NEUTROS PCT: 91 %
PLATELETS: 254 10*3/uL (ref 150–400)
RBC: 5.76 MIL/uL (ref 4.22–5.81)
RDW: 13.3 % (ref 11.5–15.5)
WBC: 19.1 10*3/uL — ABNORMAL HIGH (ref 4.0–10.5)

## 2015-10-27 MED ORDER — ONDANSETRON 8 MG PO TBDP: ORAL_TABLET | ORAL | Status: DC

## 2015-10-27 MED ORDER — ONDANSETRON HCL 4 MG/2ML IJ SOLN
4.0000 mg | Freq: Once | INTRAMUSCULAR | Status: AC
  Administered 2015-10-27: 4 mg via INTRAVENOUS
  Filled 2015-10-27: qty 2

## 2015-10-27 MED ORDER — KETOROLAC TROMETHAMINE 30 MG/ML IJ SOLN
30.0000 mg | Freq: Once | INTRAMUSCULAR | Status: AC
  Administered 2015-10-27: 30 mg via INTRAVENOUS
  Filled 2015-10-27: qty 1

## 2015-10-27 MED ORDER — SODIUM CHLORIDE 0.9 % IV BOLUS (SEPSIS)
1000.0000 mL | Freq: Once | INTRAVENOUS | Status: AC
  Administered 2015-10-27: 1000 mL via INTRAVENOUS

## 2015-10-27 NOTE — Discharge Instructions (Signed)
Zofran as prescribed as needed for nausea.  Ibuprofen 600 mg every 6 hours as needed for pain.

## 2015-10-27 NOTE — ED Notes (Signed)
Pt c/o n/v/d and back pain since last night. Denies fevers .

## 2015-10-27 NOTE — ED Provider Notes (Signed)
CSN: [id number]     Arrival date & time 10/27/15  1720 History   First MD Initiated Contact with Patient 10/27/15 1928     Chief Complaint  Patient presents with  . Diarrhea     (Consider location/radiation/quality/duration/timing/severity/associated sxs/prior Treatment) HPI Comments: Patient is a 30 year old male who presents with complaints of nausea, vomiting, diarrhea that started yesterday evening. All has been nonbloody. He denies fevers or chills. He denies ill contacts.  Patient is a 30 y.o. male presenting with diarrhea. The history is provided by the patient.  Diarrhea Quality:  Watery Severity:  Moderate Onset quality:  Sudden Duration:  24 hours Timing:  Constant Progression:  Worsening Relieved by:  Nothing Worsened by:  Nothing tried Ineffective treatments:  None tried Associated symptoms: vomiting   Associated symptoms: no abdominal pain and no fever     History reviewed. No pertinent past medical history. Past Surgical History  Procedure Laterality Date  . Tendon to arm     History reviewed. No pertinent family history. Social History  Substance Use Topics  . Smoking status: Current Every Day Smoker -- 1.00 packs/day for 10 years    Types: Cigarettes  . Smokeless tobacco: Never Used  . Alcohol Use: No    Review of Systems  Constitutional: Negative for fever.  Gastrointestinal: Positive for vomiting and diarrhea. Negative for abdominal pain.  All other systems reviewed and are negative.     Allergies  Bactrim and Penicillins  Home Medications   Prior to Admission medications   Medication Sig Start Date End Date Taking? Authorizing Provider  benzonatate (TESSALON) 100 MG capsule Take 1 capsule (100 mg total) by mouth every 8 (eight) hours. Patient not taking: Reported on 04/08/2015 07/24/14   Loren Racer, MD  cephALEXin (KEFLEX) 500 MG capsule Take 1 capsule (500 mg total) by mouth 4 (four) times daily. 12/25/12   Tammy Triplett, PA-C   cephALEXin (KEFLEX) 500 MG capsule Take 1 capsule (500 mg total) by mouth 4 (four) times daily. 02/15/13   Ivery Quale, PA-C  ciprofloxacin (CIPRO) 500 MG tablet Take 1 tablet (500 mg total) by mouth 2 (two) times daily. 04/08/15   Ivery Quale, PA-C  ibuprofen (ADVIL,MOTRIN) 600 MG tablet Take 1 tablet (600 mg total) by mouth every 6 (six) hours as needed. 07/24/14   Loren Racer, MD  levofloxacin (LEVAQUIN) 750 MG tablet Take 1 tablet (750 mg total) by mouth daily. X 7 days 07/24/14   Loren Racer, MD  mupirocin cream (BACTROBAN) 2 % Apply 1 application topically 2 (two) times daily. 04/08/15   Ivery Quale, PA-C  phenazopyridine (PYRIDIUM) 200 MG tablet Take 1 tablet (200 mg total) by mouth 3 (three) times daily. 11/19/13   Tammy Triplett, PA-C  Phenyleph-Doxylamine-DM-APAP (NYQUIL SEVERE COLD/FLU) 5-6.25-10-325 MG/15ML LIQD Take 30 mLs by mouth as needed (for cold).    Historical Provider, MD  traMADol (ULTRAM) 50 MG tablet Take 1 tablet (50 mg total) by mouth every 6 (six) hours as needed for pain. 12/25/12   Tammy Triplett, PA-C  traMADol (ULTRAM) 50 MG tablet Take 1 tablet (50 mg total) by mouth every 6 (six) hours as needed for pain. Patient not taking: Reported on 04/08/2015 02/15/13   Ivery Quale, PA-C   BP 104/56 mmHg  Pulse 69  Temp(Src) 98.9 F (37.2 C) (Oral)  Resp 16  Ht 6' (1.829 m)  Wt 146 lb (66.225 kg)  BMI 19.80 kg/m2  SpO2 97% Physical Exam  Constitutional: He is oriented to person, place, and  time. He appears well-developed and well-nourished. No distress.  HENT:  Head: Normocephalic and atraumatic.  Mouth/Throat: Oropharynx is clear and moist.  Neck: Normal range of motion. Neck supple.  Cardiovascular: Normal rate, regular rhythm and normal heart sounds.   No murmur heard. Pulmonary/Chest: Effort normal and breath sounds normal. No respiratory distress. He has no wheezes.  Abdominal: Soft. Bowel sounds are normal. He exhibits no distension. There is no  tenderness.  Musculoskeletal: Normal range of motion. He exhibits no edema.  Lymphadenopathy:    He has no cervical adenopathy.  Neurological: He is alert and oriented to person, place, and time.  Skin: Skin is warm and dry. He is not diaphoretic.  Nursing note and vitals reviewed.   ED Course  Procedures (including critical care time) Labs Review Labs Reviewed  CBC WITH DIFFERENTIAL/PLATELET - Abnormal; Notable for the following:    WBC 19.1 (*)    Hemoglobin 18.1 (*)    Neutro Abs 17.5 (*)    All other components within normal limits  BASIC METABOLIC PANEL - Abnormal; Notable for the following:    Glucose, Bld 117 (*)    All other components within normal limits    Imaging Review No results found. I have personally reviewed and evaluated these images and lab results as part of my medical decision-making.   EKG Interpretation None      MDM   Final diagnoses:  None    Patient's presentation, exam, and workup are consistent with a viral gastroenteritis. His abdomen is benign. He is feeling better after IV fluids, Zofran, and Toradol. He will be discharged with Zofran and when necessary return.    Geoffery Lyonsouglas Aman Batley, MD 10/27/15 2049

## 2016-09-13 IMAGING — CR DG CHEST 2V
3 series · 3 of 3 positions shown · non-contrast
Comparison: Chest radiograph performed 09/25/2007

CLINICAL DATA: Acute onset of dry cough and chills for 2 days.
Current history of smoking. Initial encounter.

EXAM:
CHEST  2 VIEW

[chest pa]
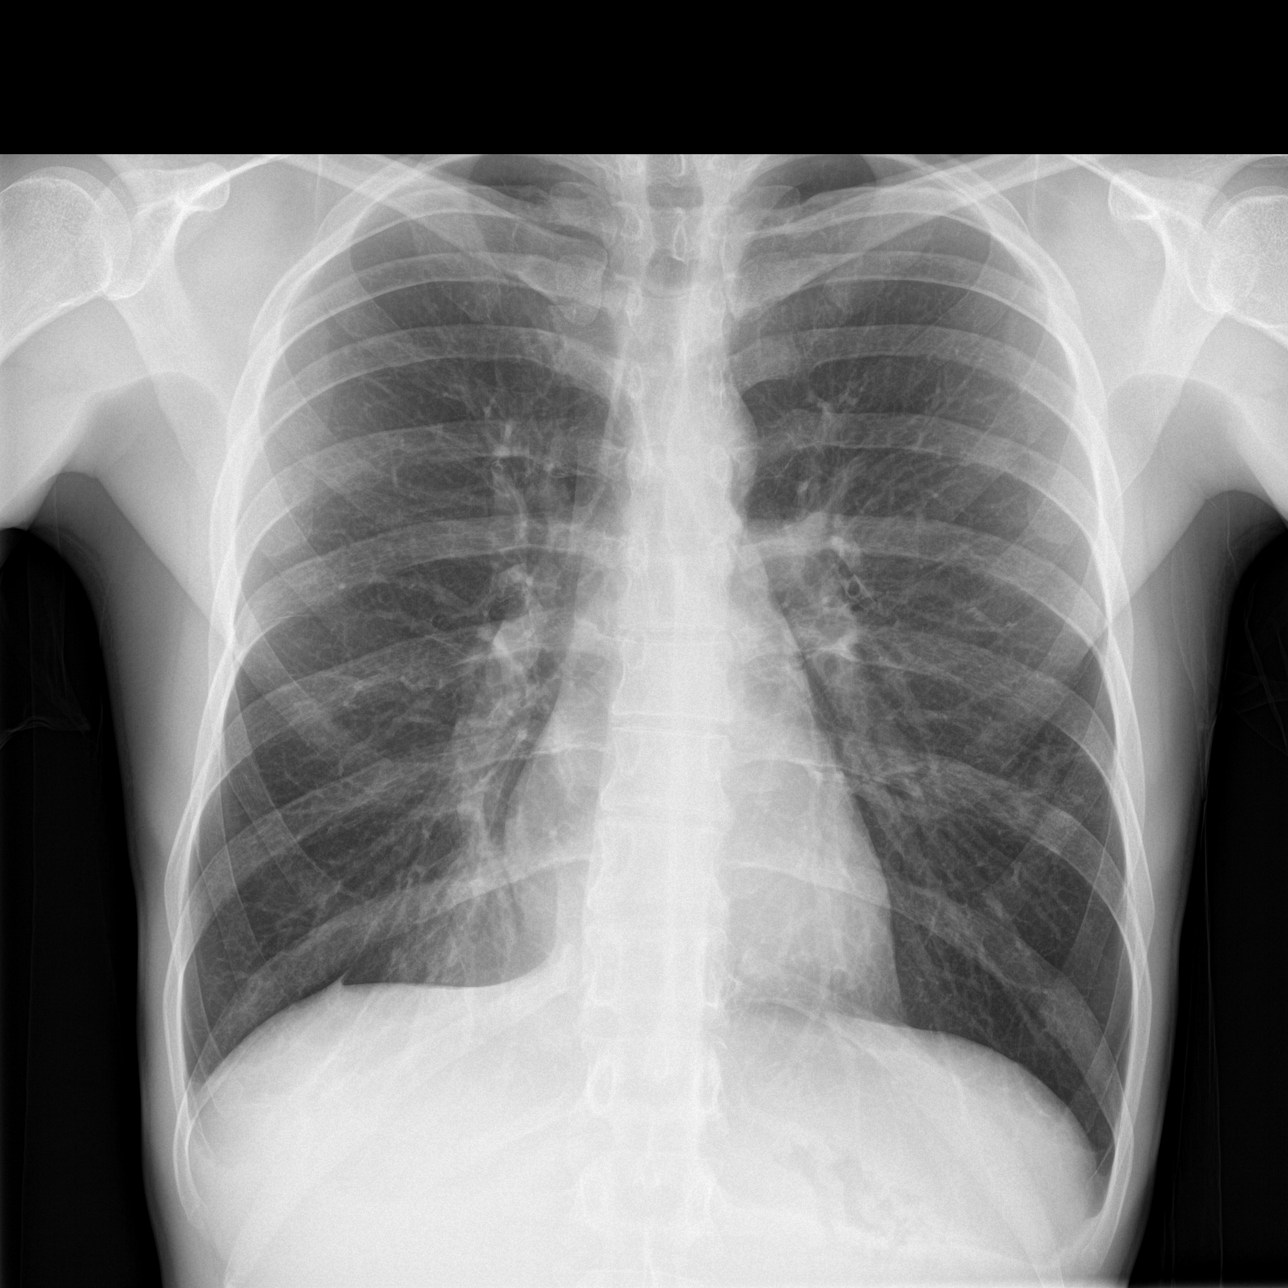

[chest lat (1 of 2)]
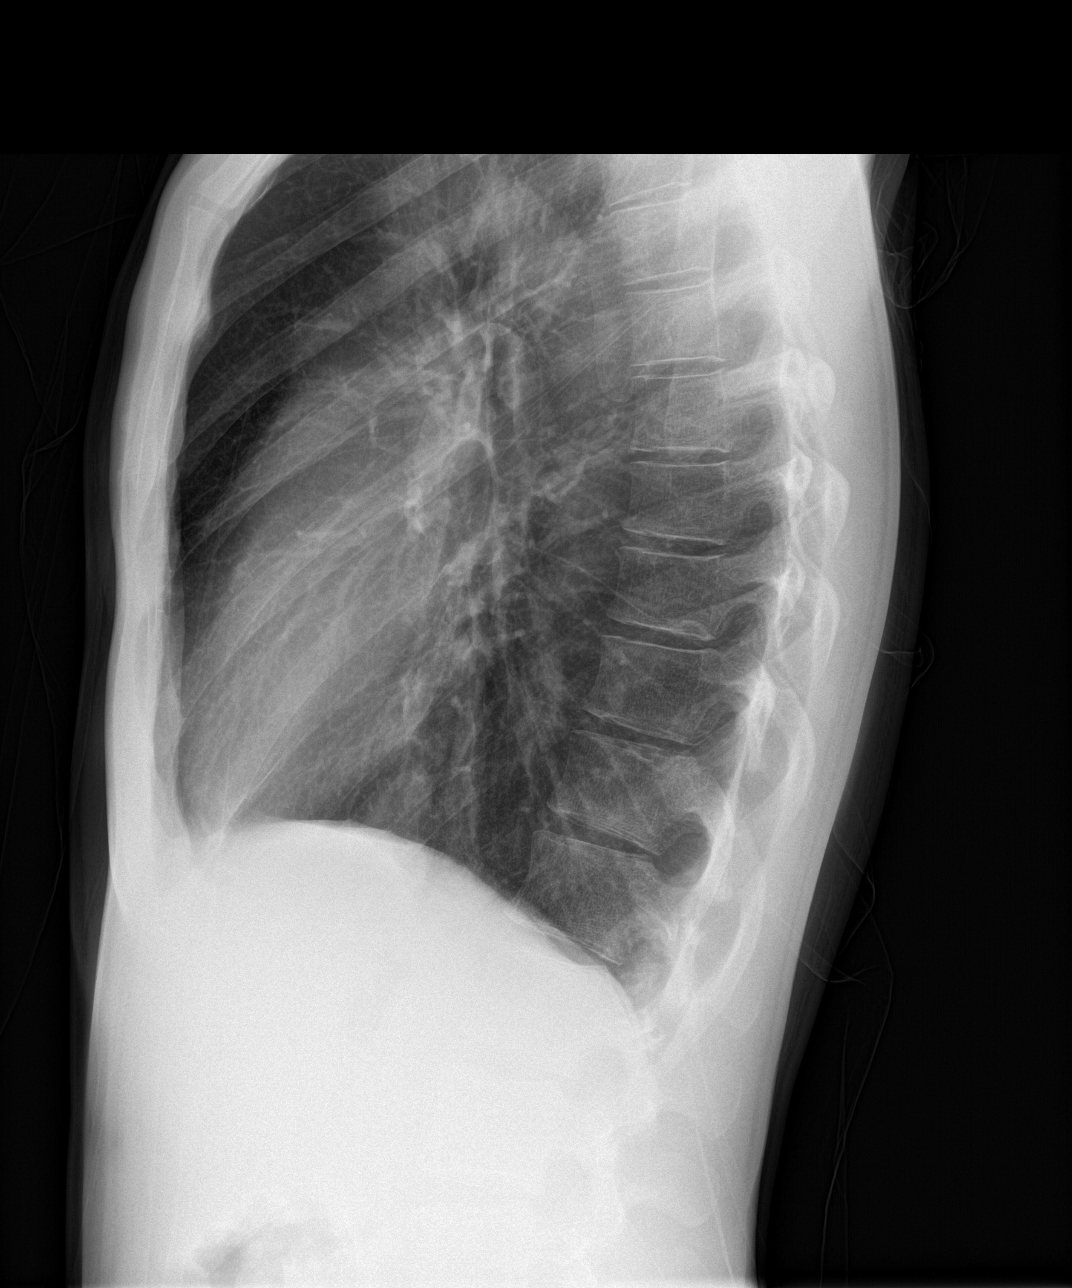

[chest lat (2 of 2)]
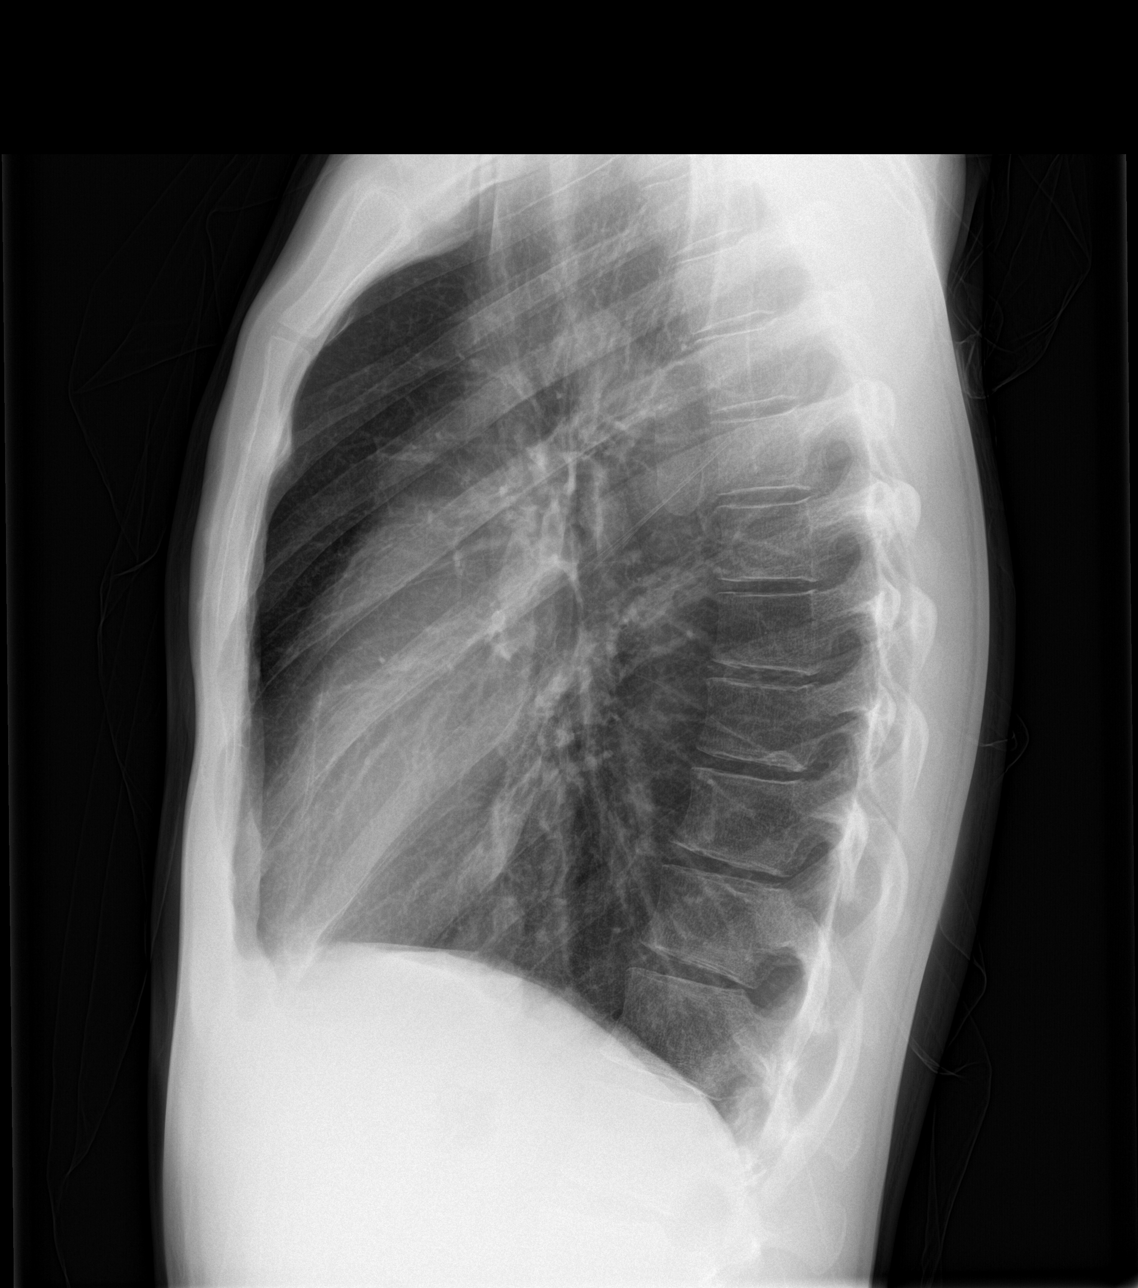

[3 of 3 positions shown; findings below may reference images not displayed]

FINDINGS: The lungs are well-aerated. Mild medial right basilar opacity raises
concern for pneumonia. There is no evidence of pleural effusion or
pneumothorax.

The heart is normal in size; the mediastinal contour is within
normal limits. No acute osseous abnormalities are seen.
IMPRESSION: Mild medial right basilar pneumonia.

## 2017-12-31 ENCOUNTER — Other Ambulatory Visit: Payer: Self-pay

## 2017-12-31 ENCOUNTER — Emergency Department (HOSPITAL_COMMUNITY)
Admission: EM | Admit: 2017-12-31 | Discharge: 2017-12-31 | Disposition: A | Discharge status: Home / Self Care | Payer: Self-pay | Attending: Emergency Medicine | Admitting: Emergency Medicine

## 2017-12-31 ENCOUNTER — Encounter (HOSPITAL_COMMUNITY): Payer: Self-pay | Admitting: Emergency Medicine

## 2017-12-31 DIAGNOSIS — T782XXA Anaphylactic shock, unspecified, initial encounter: Secondary | ICD-10-CM | POA: Insufficient documentation

## 2017-12-31 DIAGNOSIS — Z87891 Personal history of nicotine dependence: Secondary | ICD-10-CM | POA: Insufficient documentation

## 2017-12-31 MED ORDER — DIPHENHYDRAMINE HCL 50 MG/ML IJ SOLN
25.0000 mg | Freq: Once | INTRAMUSCULAR | Status: AC
  Administered 2017-12-31: 25 mg via INTRAVENOUS
  Filled 2017-12-31: qty 1

## 2017-12-31 MED ORDER — SODIUM CHLORIDE 0.9 % IV BOLUS
1000.0000 mL | Freq: Once | INTRAVENOUS | Status: AC
  Administered 2017-12-31: 1000 mL via INTRAVENOUS

## 2017-12-31 MED ORDER — ONDANSETRON HCL 4 MG/2ML IJ SOLN
4.0000 mg | Freq: Once | INTRAMUSCULAR | Status: AC
  Administered 2017-12-31: 4 mg via INTRAVENOUS
  Filled 2017-12-31: qty 2

## 2017-12-31 MED ORDER — EPINEPHRINE 0.3 MG/0.3ML IJ SOAJ: 0.3000 mg | Freq: Once | INTRAMUSCULAR | 0 refills | Status: AC

## 2017-12-31 MED ORDER — FAMOTIDINE IN NACL 20-0.9 MG/50ML-% IV SOLN
20.0000 mg | Freq: Once | INTRAVENOUS | Status: AC
  Administered 2017-12-31: 20 mg via INTRAVENOUS
  Filled 2017-12-31: qty 50

## 2017-12-31 MED ORDER — FAMOTIDINE 20 MG PO TABS: 20.0000 mg | ORAL_TABLET | Freq: Two times a day (BID) | ORAL | 0 refills | Status: DC

## 2017-12-31 MED ORDER — PREDNISONE 20 MG PO TABS: ORAL_TABLET | ORAL | 0 refills | Status: DC

## 2017-12-31 MED ORDER — DIPHENHYDRAMINE HCL 25 MG PO TABS: 25.0000 mg | ORAL_TABLET | Freq: Four times a day (QID) | ORAL | 0 refills | Status: DC

## 2017-12-31 MED ORDER — METHYLPREDNISOLONE SODIUM SUCC 125 MG IJ SOLR
125.0000 mg | Freq: Once | INTRAMUSCULAR | Status: AC
  Administered 2017-12-31: 125 mg via INTRAVENOUS
  Filled 2017-12-31: qty 2

## 2017-12-31 MED ORDER — EPINEPHRINE 0.3 MG/0.3ML IJ SOAJ
0.3000 mg | Freq: Once | INTRAMUSCULAR | Status: AC
  Administered 2017-12-31: 0.3 mg via INTRAMUSCULAR
  Filled 2017-12-31: qty 0.3

## 2017-12-31 NOTE — ED Provider Notes (Signed)
Emergency Department Provider Note   I have reviewed the triage vital signs and the nursing notes.   HISTORY  Chief Complaint Allergic Reaction   HPI Chad Hill is a 32 y.o. male who was mowing and had acute onset of hives and feeling that his lips were swelling in his throat was closing.  He immediately started to come here stop at a drugstore to Benadryl and vomited afterwards.  He still has significant itching but he says the hives seem to have improved.  Never denies before.  No known allergies.  He states he also felt cold while he was outside.  No alcohol or drugs today.  States that the itching feels like is coming back however does not feel the lip swelling anymore but does still feel a bit short of breath and nauseated. No other associated or modifying symptoms.    History reviewed. No pertinent past medical history.  There are no active problems to display for this patient.   Past Surgical History:  Procedure Laterality Date  . tendon to arm      Current Outpatient Rx  . Order #: [id number] Class: Historical Med  . Order #: [id number] Class: Print  . Order #: [id number] Class: Print  . Order #: [id number] Class: Print  . Order #: [id number] Class: Print  . Order #: [id number] Class: Historical Med  . Order #: [id number] Class: Print    Allergies Bactrim and Penicillins  History reviewed. No pertinent family history.  Social History Social History   Tobacco Use  . Smoking status: Former Smoker    Packs/day: 1.00    Years: 10.00    Pack years: 10.00    Types: Cigarettes  . Smokeless tobacco: Never Used  Substance Use Topics  . Alcohol use: No  . Drug use: No    Review of Systems  All other systems negative except as documented in the HPI. All pertinent positives and negatives as reviewed in the HPI. ____________________________________________   PHYSICAL EXAM:  VITAL SIGNS: ED Triage Vitals  Enc Vitals Group     BP 12/31/17 1738 125/85     Pulse  Rate 12/31/17 1737 (!) 126     Resp 12/31/17 1737 (!) 22     Temp 12/31/17 1737 98.9 F (37.2 C)     Temp Source 12/31/17 1737 Oral     SpO2 12/31/17 1737 98 %     Weight 12/31/17 1737 145 lb (65.8 kg)     Height 12/31/17 1737 6' (1.829 m)    Constitutional: Alert and oriented. Well appearing and in no acute distress. Eyes: Conjunctivae are normal. PERRL. EOMI. Head: Atraumatic. Nose: No congestion/rhinnorhea. Mouth/Throat: Mucous membranes are moist.  Oropharynx non-erythematous. Neck: No stridor.  No meningeal signs.   Cardiovascular: Normal rate, regular rhythm. Good peripheral circulation. Grossly normal heart sounds.   Respiratory: Normal respiratory effort.  No retractions. Mild end exp wheezing.  Gastrointestinal: Soft and nontender. No distention.  Musculoskeletal: No lower extremity tenderness nor edema. No gross deformities of extremities. Neurologic:  Normal speech and language. No gross focal neurologic deficits are appreciated.  Skin:  Skin is warm, dry and intact. Diffuse erythema. Mild hives to upper legs/buttocks. Excoriation noticed in multiple spots.    ____________________________________________   LABS (all labs ordered are listed, but only abnormal results are displayed)  Labs Reviewed - No data to display ____________________________________________  EKG   EKG Interpretation  Date/Time:    Ventricular Rate:    PR Interval:    QRS Duration:   QT  Interval:    QTC Calculation:   R Axis:     Text Interpretation:         ____________________________________________  RADIOLOGY  No results found.  ____________________________________________   PROCEDURES  Procedure(s) performed:   Procedures  CRITICAL CARE Performed by: Marily MemosMesner, Mayan Dolney Total critical care time: 35 minutes Critical care time was exclusive of separately billable procedures and treating other patients. Critical care was necessary to treat or prevent imminent or  life-threatening deterioration. Critical care was time spent personally by me on the following activities: development of treatment plan with patient and/or surrogate as well as nursing, discussions with consultants, evaluation of patient's response to treatment, examination of patient, obtaining history from patient or surrogate, ordering and performing treatments and interventions, ordering and review of laboratory studies, ordering and review of radiographic studies, pulse oximetry and re-evaluation of patient's condition. ____________________________________________   INITIAL IMPRESSION / ASSESSMENT AND PLAN / ED COURSE  With acute onset hives, feeling of swelling in throat, vomiting worry for anaphylaxis. Will treat for same.  ecg for tachycardia.  zofran for nausea but expect other meds to help that.   Improved with meds. Epi pen and continued treatment at home.   Pertinent labs & imaging results that were available during my care of the patient were reviewed by me and considered in my medical decision making (see chart for details).  ____________________________________________  FINAL CLINICAL IMPRESSION(S) / ED DIAGNOSES  Final diagnoses:  Anaphylaxis, initial encounter     MEDICATIONS GIVEN DURING THIS VISIT:  Medications  diphenhydrAMINE (BENADRYL) injection 25 mg (25 mg Intravenous Given 12/31/17 1802)  famotidine (PEPCID) IVPB 20 mg premix (0 mg Intravenous Stopped 12/31/17 1842)  methylPREDNISolone sodium succinate (SOLU-MEDROL) 125 mg/2 mL injection 125 mg (125 mg Intravenous Given 12/31/17 1805)  EPINEPHrine (EPI-PEN) injection 0.3 mg (0.3 mg Intramuscular Given 12/31/17 1759)  ondansetron (ZOFRAN) injection 4 mg (4 mg Intravenous Given 12/31/17 1808)  sodium chloride 0.9 % bolus 1,000 mL (0 mLs Intravenous Stopped 12/31/17 1924)     NEW OUTPATIENT MEDICATIONS STARTED DURING THIS VISIT:  New Prescriptions   DIPHENHYDRAMINE (BENADRYL) 25 MG TABLET    Take 1 tablet (25 mg  total) by mouth every 6 (six) hours.   EPINEPHRINE (EPIPEN 2-PAK) 0.3 MG/0.3 ML IJ SOAJ INJECTION    Inject 0.3 mLs (0.3 mg total) into the muscle once for 1 dose.   FAMOTIDINE (PEPCID) 20 MG TABLET    Take 1 tablet (20 mg total) by mouth 2 (two) times daily.   PREDNISONE (DELTASONE) 20 MG TABLET    3 tabs po daily x 3 days, then 2 tabs x 3 days, then 1.5 tabs x 3 days, then 1 tab x 3 days, then 0.5 tabs x 3 days    Note:  This note was prepared with assistance of Dragon voice recognition software. Occasional wrong-word or sound-a-like substitutions may have occurred due to the inherent limitations of voice recognition software.   Marily MemosMesner, Kenisha Lynds, MD 12/31/17 2115

## 2017-12-31 NOTE — ED Triage Notes (Signed)
Pt reports 30 minutes ago suddenly breaking out in hives while driving.  Took 2 benadryl, but vomited them up.  States he does not feel right and feels like his throat is tight.  No visible hives noted.

## 2017-12-31 NOTE — ED Notes (Signed)
EDP at bedside  

## 2019-10-14 ENCOUNTER — Ambulatory Visit: Payer: Self-pay | Attending: Internal Medicine

## 2019-10-14 ENCOUNTER — Other Ambulatory Visit: Payer: Self-pay

## 2019-10-14 DIAGNOSIS — Z20822 Contact with and (suspected) exposure to covid-19: Secondary | ICD-10-CM

## 2019-10-15 LAB — NOVEL CORONAVIRUS, NAA: SARS-CoV-2, NAA: NOT DETECTED

## 2020-02-08 ENCOUNTER — Emergency Department (HOSPITAL_COMMUNITY): Payer: Self-pay

## 2020-02-08 ENCOUNTER — Encounter (HOSPITAL_COMMUNITY): Payer: Self-pay | Admitting: Emergency Medicine

## 2020-02-08 ENCOUNTER — Other Ambulatory Visit: Payer: Self-pay

## 2020-02-08 ENCOUNTER — Emergency Department (HOSPITAL_COMMUNITY)
Admission: EM | Admit: 2020-02-08 | Discharge: 2020-02-08 | Disposition: A | Discharge status: Home / Self Care | Payer: Self-pay | Attending: Emergency Medicine | Admitting: Emergency Medicine

## 2020-02-08 DIAGNOSIS — Z720 Tobacco use: Secondary | ICD-10-CM

## 2020-02-08 DIAGNOSIS — E860 Dehydration: Secondary | ICD-10-CM | POA: Insufficient documentation

## 2020-02-08 DIAGNOSIS — J181 Lobar pneumonia, unspecified organism: Secondary | ICD-10-CM | POA: Insufficient documentation

## 2020-02-08 DIAGNOSIS — F17210 Nicotine dependence, cigarettes, uncomplicated: Secondary | ICD-10-CM | POA: Insufficient documentation

## 2020-02-08 DIAGNOSIS — J189 Pneumonia, unspecified organism: Secondary | ICD-10-CM

## 2020-02-08 DIAGNOSIS — Z79899 Other long term (current) drug therapy: Secondary | ICD-10-CM | POA: Insufficient documentation

## 2020-02-08 DIAGNOSIS — Z20822 Contact with and (suspected) exposure to covid-19: Secondary | ICD-10-CM | POA: Insufficient documentation

## 2020-02-08 DIAGNOSIS — R000 Tachycardia, unspecified: Secondary | ICD-10-CM | POA: Insufficient documentation

## 2020-02-08 LAB — CBC WITH DIFFERENTIAL/PLATELET
Abs Immature Granulocytes: 0.12 10*3/uL — ABNORMAL HIGH (ref 0.00–0.07)
Basophils Absolute: 0.1 10*3/uL (ref 0.0–0.1)
Basophils Relative: 0 %
Eosinophils Absolute: 0 10*3/uL (ref 0.0–0.5)
Eosinophils Relative: 0 %
HCT: 45.6 % (ref 39.0–52.0)
Hemoglobin: 15.4 g/dL (ref 13.0–17.0)
Immature Granulocytes: 1 %
Lymphocytes Relative: 6 %
Lymphs Abs: 1.5 10*3/uL (ref 0.7–4.0)
MCH: 30.5 pg (ref 26.0–34.0)
MCHC: 33.8 g/dL (ref 30.0–36.0)
MCV: 90.3 fL (ref 80.0–100.0)
Monocytes Absolute: 1.6 10*3/uL — ABNORMAL HIGH (ref 0.1–1.0)
Monocytes Relative: 6 %
Neutro Abs: 21.5 10*3/uL — ABNORMAL HIGH (ref 1.7–7.7)
Neutrophils Relative %: 87 %
Platelets: 354 10*3/uL (ref 150–400)
RBC: 5.05 MIL/uL (ref 4.22–5.81)
RDW: 13 % (ref 11.5–15.5)
WBC: 24.9 10*3/uL — ABNORMAL HIGH (ref 4.0–10.5)
nRBC: 0 % (ref 0.0–0.2)

## 2020-02-08 LAB — COMPREHENSIVE METABOLIC PANEL
ALT: 19 U/L (ref 0–44)
AST: 20 U/L (ref 15–41)
Albumin: 4.1 g/dL (ref 3.5–5.0)
Alkaline Phosphatase: 73 U/L (ref 38–126)
Anion gap: 12 (ref 5–15)
BUN: 12 mg/dL (ref 6–20)
CO2: 24 mmol/L (ref 22–32)
Calcium: 9.3 mg/dL (ref 8.9–10.3)
Chloride: 102 mmol/L (ref 98–111)
Creatinine, Ser: 0.86 mg/dL (ref 0.61–1.24)
GFR calc Af Amer: >60 mL/min (ref >60)
GFR calc non Af Amer: >60 mL/min (ref >60)
Glucose, Bld: 124 mg/dL — ABNORMAL HIGH (ref 70–99)
Potassium: 3.6 mmol/L (ref 3.5–5.1)
Sodium: 138 mmol/L (ref 135–145)
Total Bilirubin: 0.7 mg/dL (ref 0.3–1.2)
Total Protein: 8.3 g/dL — ABNORMAL HIGH (ref 6.5–8.1)

## 2020-02-08 LAB — URINALYSIS, ROUTINE W REFLEX MICROSCOPIC
Bilirubin Urine: NEGATIVE
Glucose, UA: NEGATIVE mg/dL
Ketones, ur: 5 mg/dL — AB
Leukocytes,Ua: NEGATIVE
Nitrite: NEGATIVE
Protein, ur: 100 mg/dL — AB
Specific Gravity, Urine: 1.027 (ref 1.005–1.030)
pH: 5 (ref 5.0–8.0)

## 2020-02-08 LAB — SARS CORONAVIRUS 2 BY RT PCR (HOSPITAL ORDER, PERFORMED IN ~~LOC~~ HOSPITAL LAB): SARS Coronavirus 2: NEGATIVE

## 2020-02-08 LAB — LIPASE, BLOOD: Lipase: 19 U/L (ref 11–51)

## 2020-02-08 MED ORDER — AZITHROMYCIN 250 MG PO TABS
500.0000 mg | ORAL_TABLET | Freq: Once | ORAL | Status: AC
  Administered 2020-02-08: 500 mg via ORAL
  Filled 2020-02-08: qty 2

## 2020-02-08 MED ORDER — SODIUM CHLORIDE 0.9 % IV BOLUS
1000.0000 mL | Freq: Once | INTRAVENOUS | Status: AC
  Administered 2020-02-08: 1000 mL via INTRAVENOUS

## 2020-02-08 MED ORDER — METHYLPREDNISOLONE SODIUM SUCC 125 MG IJ SOLR
125.0000 mg | Freq: Once | INTRAMUSCULAR | Status: AC
  Administered 2020-02-08: 125 mg via INTRAVENOUS
  Filled 2020-02-08: qty 2

## 2020-02-08 MED ORDER — ALBUTEROL SULFATE HFA 108 (90 BASE) MCG/ACT IN AERS
2.0000 | INHALATION_SPRAY | Freq: Once | RESPIRATORY_TRACT | Status: AC
  Administered 2020-02-08: 2 via RESPIRATORY_TRACT
  Filled 2020-02-08: qty 6.7

## 2020-02-08 MED ORDER — PREDNISONE 10 MG (21) PO TBPK: ORAL_TABLET | ORAL | 0 refills | Status: DC

## 2020-02-08 MED ORDER — DOXYCYCLINE HYCLATE 100 MG PO CAPS: 100.0000 mg | ORAL_CAPSULE | Freq: Two times a day (BID) | ORAL | 0 refills | Status: DC

## 2020-02-08 MED ORDER — ACETAMINOPHEN 500 MG PO TABS
1000.0000 mg | ORAL_TABLET | Freq: Once | ORAL | Status: AC
  Administered 2020-02-08: 1000 mg via ORAL
  Filled 2020-02-08: qty 2

## 2020-02-08 MED ORDER — SODIUM CHLORIDE 0.9 % IV SOLN
1.0000 g | Freq: Once | INTRAVENOUS | Status: AC
  Administered 2020-02-08: 1 g via INTRAVENOUS
  Filled 2020-02-08: qty 10

## 2020-02-08 MED ORDER — AEROCHAMBER PLUS FLO-VU MEDIUM MISC
1.0000 | Freq: Once | Status: AC
  Administered 2020-02-08: 1
  Filled 2020-02-08: qty 1

## 2020-02-08 NOTE — Discharge Instructions (Addendum)
Try to stop smoking.   Drink plenty of water.

## 2020-02-08 NOTE — ED Provider Notes (Signed)
[facility] EMERGENCY DEPARTMENT Provider Note   CSN: [id number] Arrival date & time: 02/08/20  1310     History Chief Complaint  Patient presents with  . Shortness of Breath    Chad Hill is a 34 y.o. male.  Pt presents to the ED today with sob and cough for the past week.  Pt is unsure if he's had fever at home, but has had chills/sweats at night.  Pt said he's had a productive cough with yellow sputum.  He has not been vaccinated against Covid. No known Covid contacts.  Pt also has some low back pain with dark urine.  He feels he's been drinking enough water.        History reviewed. No pertinent past medical history.  There are no problems to display for this patient.   Past Surgical History:  Procedure Laterality Date  . tendon to arm         History reviewed. No pertinent family history.  Social History   Tobacco Use  . Smoking status: Current Every Day Smoker    Packs/day: 1.00    Years: 10.00    Pack years: 10.00    Types: Cigarettes  . Smokeless tobacco: Never Used  Vaping Use  . Vaping Use: Never used  Substance Use Topics  . Alcohol use: No  . Drug use: No    Home Medications Prior to Admission medications   Medication Sig Start Date End Date Taking? Authorizing Provider  diphenhydrAMINE (BENADRYL) 25 mg capsule Take 50 mg by mouth once as needed for itching.    [provider]  diphenhydrAMINE (BENADRYL) 25 MG tablet Take 1 tablet (25 mg total) by mouth every 6 (six) hours. 12/31/17   Mesner, Barbara Cower, MD  doxycycline (VIBRAMYCIN) 100 MG capsule Take 1 capsule (100 mg total) by mouth 2 (two) times daily. 02/08/20   Jacalyn Lefevre, MD  famotidine (PEPCID) 20 MG tablet Take 1 tablet (20 mg total) by mouth 2 (two) times daily. 12/31/17   Mesner, Barbara Cower, MD  ibuprofen (ADVIL,MOTRIN) 600 MG tablet Take 1 tablet (600 mg total) by mouth every 6 (six) hours as needed. 07/24/14   Loren Racer, MD  Phenyleph-Doxylamine-DM-APAP (NYQUIL SEVERE  COLD/FLU) 5-6.25-10-325 MG/15ML LIQD Take 30 mLs by mouth as needed (for cold).    [provider]  predniSONE (STERAPRED UNI-PAK 21 TAB) 10 MG (21) TBPK tablet Take 6 tabs for 2 days, then 5 for 2 days, then 4 for 2 days, then 3 for 2 days, 2 for 2 days, then 1 for 2 days 02/08/20   Jacalyn Lefevre, MD    Allergies    Bactrim and Penicillins  Review of Systems   Review of Systems  Constitutional: Positive for chills.  Respiratory: Positive for cough and shortness of breath.   All other systems reviewed and are negative.   Physical Exam Updated Vital Signs BP 121/65   Pulse 81   Temp 99.4 F (37.4 C) (Oral)   Resp 20   Ht 6' (1.829 m)   Wt 68 kg   SpO2 95%   BMI 20.34 kg/m   Physical Exam Vitals and nursing note reviewed.  Constitutional:      Appearance: He is well-developed.  HENT:     Head: Normocephalic and atraumatic.     Mouth/Throat:     Mouth: Mucous membranes are dry.     Pharynx: Oropharynx is clear.  Eyes:     Extraocular Movements: Extraocular movements intact.     Pupils:  Pupils are equal, round, and reactive to light.  Cardiovascular:     Rate and Rhythm: Regular rhythm. Tachycardia present.  Pulmonary:     Effort: Pulmonary effort is normal.     Breath sounds: Wheezing present.  Abdominal:     General: Bowel sounds are normal.     Palpations: Abdomen is soft.  Musculoskeletal:        General: Normal range of motion.     Cervical back: Normal range of motion and neck supple.  Skin:    General: Skin is warm.     Capillary Refill: Capillary refill takes less than 2 seconds.  Neurological:     General: No focal deficit present.     Mental Status: He is alert and oriented to person, place, and time.  Psychiatric:        Mood and Affect: Mood normal.        Behavior: Behavior normal.     ED Results / Procedures / Treatments   Labs (all labs ordered are listed, but only abnormal results are displayed) Labs Reviewed  CBC WITH  DIFFERENTIAL/PLATELET - Abnormal; Notable for the following components:      Result Value   WBC 24.9 (*)    Neutro Abs 21.5 (*)    Monocytes Absolute 1.6 (*)    Abs Immature Granulocytes 0.12 (*)    All other components within normal limits  COMPREHENSIVE METABOLIC PANEL - Abnormal; Notable for the following components:   Glucose, Bld 124 (*)    Total Protein 8.3 (*)    All other components within normal limits  URINALYSIS, ROUTINE W REFLEX MICROSCOPIC - Abnormal; Notable for the following components:   APPearance HAZY (*)    Hgb urine dipstick SMALL (*)    Ketones, ur 5 (*)    Protein, ur 100 (*)    Bacteria, UA RARE (*)    All other components within normal limits  SARS CORONAVIRUS 2 BY RT PCR (HOSPITAL ORDER, PERFORMED IN [facility])  LIPASE, BLOOD    EKG EKG Interpretation  Date/Time:  Sunday February 08 2020 13:22:46 EDT Ventricular Rate:  102 PR Interval:    QRS Duration: 86 QT Interval:  324 QTC Calculation: 422 R Axis:   117 Text Interpretation: Sinus tachycardia Right axis deviation ST elev, probable normal early repol pattern No significant change since last tracing Confirmed by Jacalyn Lefevre [phone]) on 02/08/2020 1:40:34 PM   Radiology DG Chest Portable 1 View  Result Date: 02/08/2020 CLINICAL DATA:  Shortness of breath and productive cough. Chills and night sweats. EXAM: PORTABLE CHEST 1 VIEW COMPARISON:  07/24/2014 FINDINGS: Subtle reticulonodular density at the right base. No edema, effusion, or pneumothorax. Normal heart size. IMPRESSION: Early bronchopneumonia at the right base. Followup PA and lateral chest X-ray is recommended in 3-4 weeks following trial of antibiotic therapy to ensure resolution. Electronically Signed   By: Marnee Spring M.D.   On: 02/08/2020 14:31    Procedures Procedures (including critical care time)  Medications Ordered in ED Medications  AeroChamber Plus Flo-Vu Medium MISC 1 each (has no administration in time range)   cefTRIAXone (ROCEPHIN) 1 g in sodium chloride 0.9 % 100 mL IVPB (has no administration in time range)  azithromycin (ZITHROMAX) tablet 500 mg (has no administration in time range)  methylPREDNISolone sodium succinate (SOLU-MEDROL) 125 mg/2 mL injection 125 mg (has no administration in time range)  sodium chloride 0.9 % bolus 1,000 mL (1,000 mLs Intravenous New Bag/Given 02/08/20 1346)  albuterol (VENTOLIN  HFA) 108 (90 Base) MCG/ACT inhaler 2 puff (2 puffs Inhalation Given 02/08/20 1344)  acetaminophen (TYLENOL) tablet 1,000 mg (1,000 mg Oral Given 02/08/20 1348)    ED Course  I have reviewed the triage vital signs and the nursing notes.  Pertinent labs & imaging results that were available during my care of the patient were reviewed by me and considered in my medical decision making (see chart for details).    MDM Rules/Calculators/A&P                          Pt is feeling much better after treatment.  Fortunately, he does not have Covid.  He does have RLL pneumonia and is treated with rocephin/zithromax.    SOB improved after nebs and steroids.  O2 sats in the mid-90s.  Chad Hill was evaluated in Emergency Department on 02/08/2020 for the symptoms described in the history of present illness. He was evaluated in the context of the global COVID-19 pandemic, which necessitated consideration that the patient might be at risk for infection with the SARS-CoV-2 virus that causes COVID-19. Institutional protocols and algorithms that pertain to the evaluation of patients at risk for COVID-19 are in a state of rapid change based on information released by regulatory bodies including the CDC and federal and state organizations. These policies and algorithms were followed during the patient's care in the ED. Final Clinical Impression(s) / ED Diagnoses Final diagnoses:  Community acquired pneumonia of right lower lobe of lung  Tobacco abuse  Dehydration    Rx / DC Orders ED Discharge Orders          Ordered    doxycycline (VIBRAMYCIN) 100 MG capsule  2 times daily     Discontinue  Reprint     02/08/20 1512    predniSONE (STERAPRED UNI-PAK 21 TAB) 10 MG (21) TBPK tablet     Discontinue  Reprint     02/08/20 1512           Jacalyn Lefevre, MD 02/08/20 1513

## 2020-02-08 NOTE — ED Triage Notes (Signed)
Patient c/o shortness of breath with productive x1 week. Per patient dark, thick yellow sputum. Patient unsure of any fevers but reports chills/sweats at night. Denies any chest pain. Per patient mid-lower back pain with dark urine/oliguria x3-4 days.

## 2022-03-31 IMAGING — DX DG CHEST 1V PORT
2 series · 2 of 2 positions shown · non-contrast
Comparison: 07/24/2014

CLINICAL DATA: Shortness of breath and productive cough. Chills and
night sweats.

EXAM:
PORTABLE CHEST 1 VIEW

[chest ap (1 of 2)]
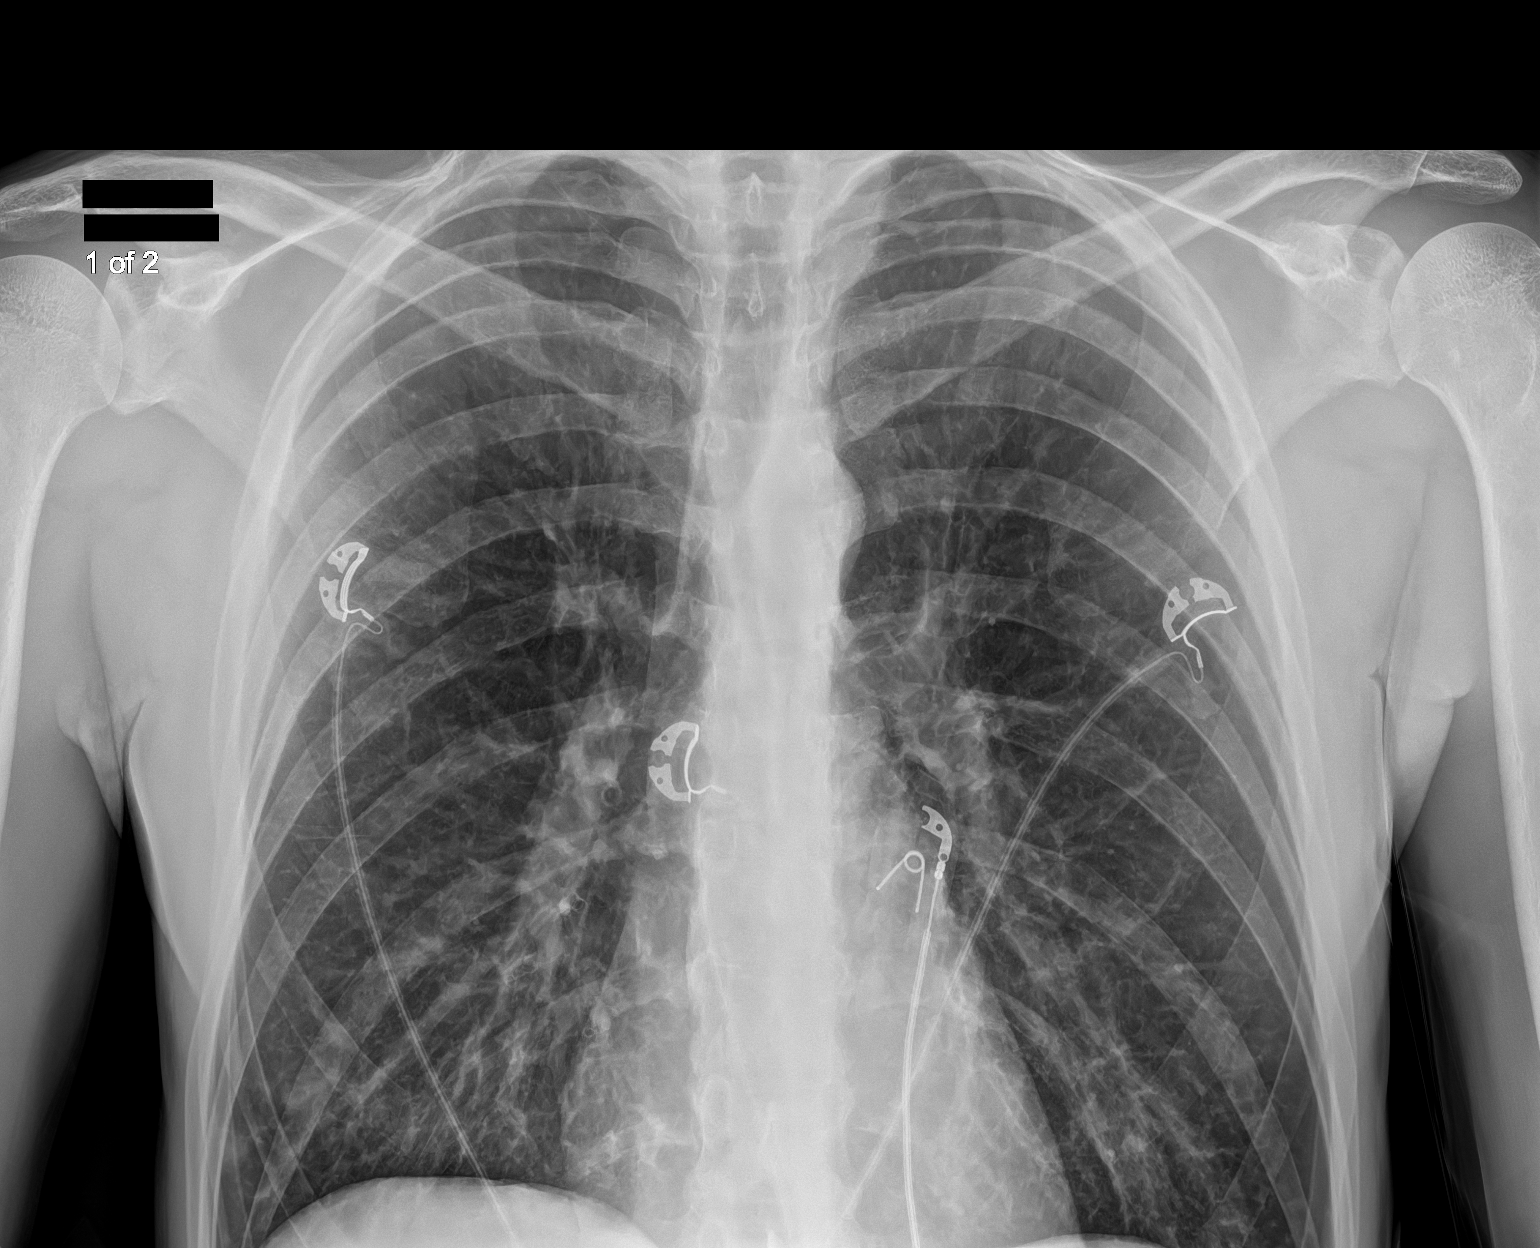

[chest ap (2 of 2)]
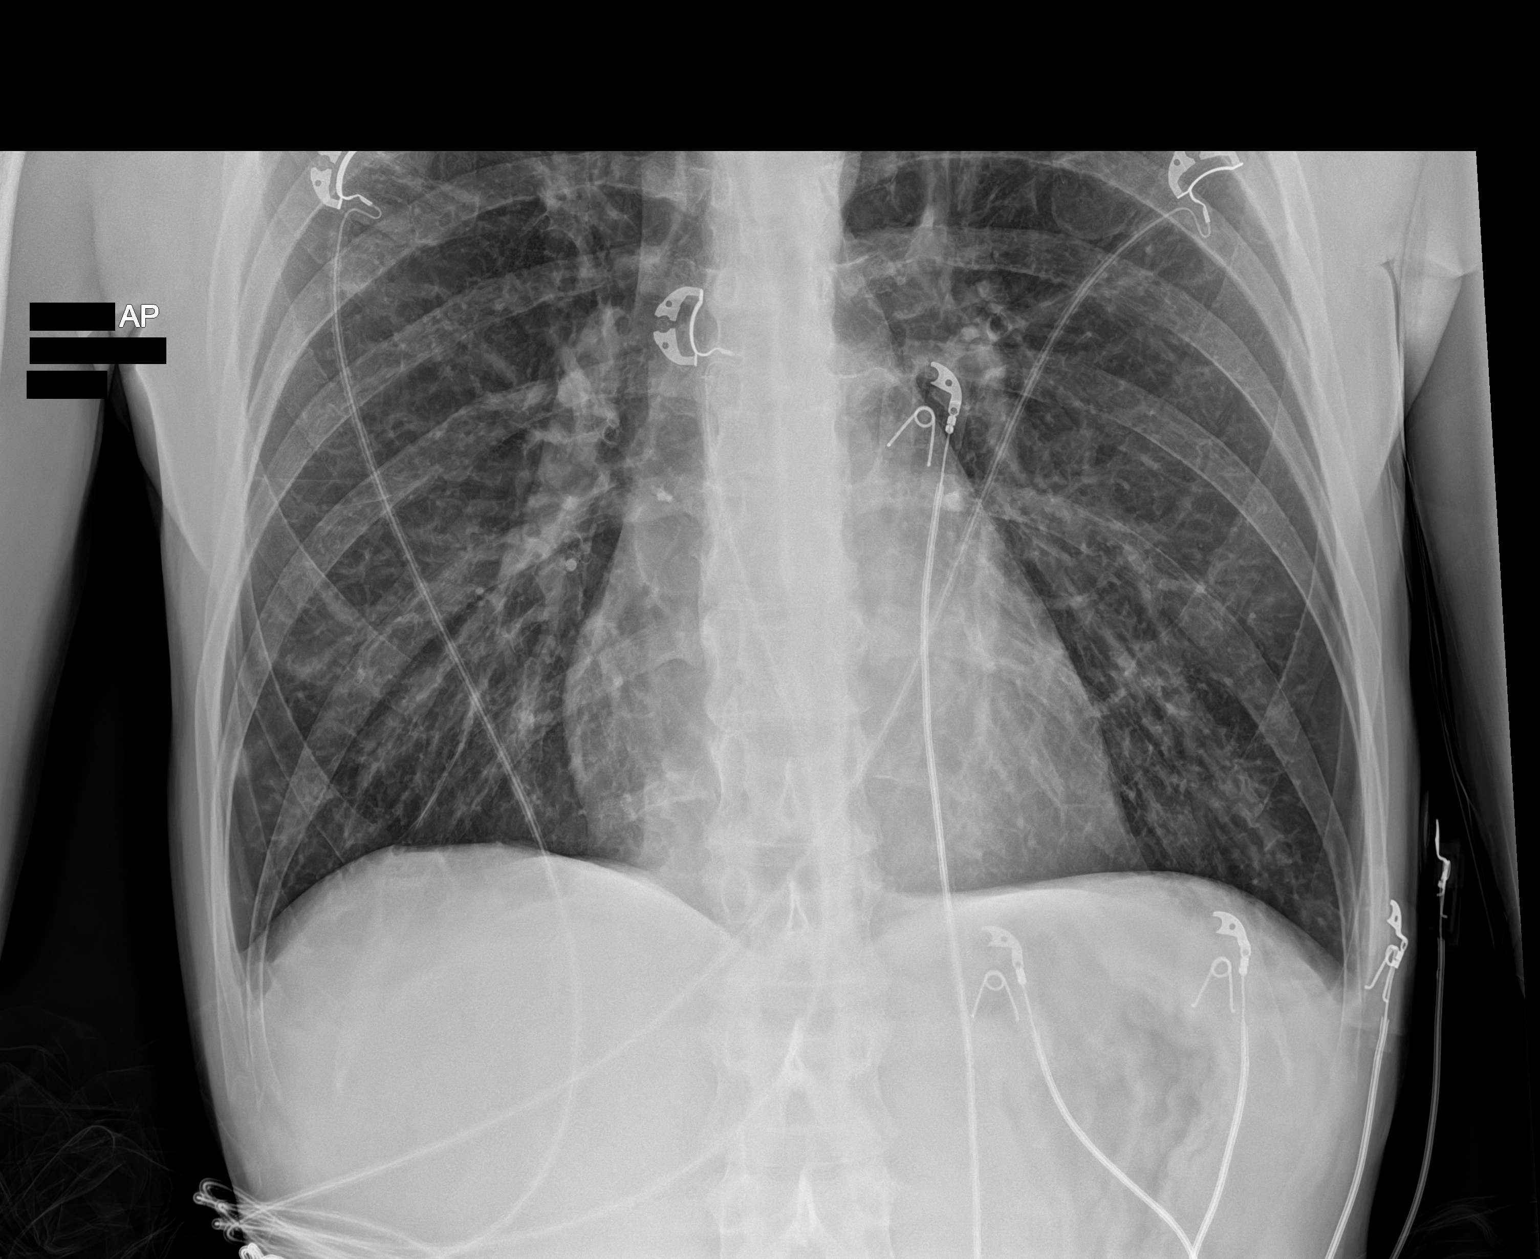

[2 of 2 positions shown; findings below may reference images not displayed]

FINDINGS: Subtle reticulonodular density at the right base. No edema,
effusion, or pneumothorax. Normal heart size.
IMPRESSION: Early bronchopneumonia at the right base. Followup PA and lateral
chest X-ray is recommended in 3-4 weeks following trial of
antibiotic therapy to ensure resolution.

## 2022-04-16 ENCOUNTER — Emergency Department (HOSPITAL_COMMUNITY): Payer: Self-pay

## 2022-04-16 ENCOUNTER — Encounter (HOSPITAL_COMMUNITY): Payer: Self-pay

## 2022-04-16 ENCOUNTER — Other Ambulatory Visit: Payer: Self-pay

## 2022-04-16 ENCOUNTER — Emergency Department (HOSPITAL_COMMUNITY)
Admission: EM | Admit: 2022-04-16 | Discharge: 2022-04-16 | Disposition: A | Discharge status: Home / Self Care | Payer: Self-pay | Attending: Emergency Medicine | Admitting: Emergency Medicine

## 2022-04-16 DIAGNOSIS — R109 Unspecified abdominal pain: Secondary | ICD-10-CM | POA: Insufficient documentation

## 2022-04-16 DIAGNOSIS — M545 Low back pain, unspecified: Secondary | ICD-10-CM | POA: Insufficient documentation

## 2022-04-16 LAB — URINALYSIS, ROUTINE W REFLEX MICROSCOPIC
Bacteria, UA: NONE SEEN
Bilirubin Urine: NEGATIVE
Glucose, UA: NEGATIVE mg/dL
Ketones, ur: NEGATIVE mg/dL
Leukocytes,Ua: NEGATIVE
Nitrite: NEGATIVE
Protein, ur: NEGATIVE mg/dL
Specific Gravity, Urine: 1.003 — ABNORMAL LOW (ref 1.005–1.030)
pH: 7 (ref 5.0–8.0)

## 2022-04-16 MED ORDER — DICLOFENAC SODIUM 75 MG PO TBEC: 75.0000 mg | DELAYED_RELEASE_TABLET | Freq: Two times a day (BID) | ORAL | 0 refills | Status: AC

## 2022-04-16 MED ORDER — METHOCARBAMOL 500 MG PO TABS: 500.0000 mg | ORAL_TABLET | Freq: Four times a day (QID) | ORAL | 0 refills | Status: AC

## 2022-04-16 NOTE — ED Provider Notes (Signed)
[facility] EMERGENCY DEPARTMENT Provider Note   CSN: [id number] Arrival date & time: 04/16/22  1235     History  Chief Complaint  Patient presents with   Back Pain    Chad Hill is a 36 y.o. male.  The history is provided by the patient. No language interpreter was used.  Back Pain Location:  Lumbar spine Quality:  Aching Radiates to:  Does not radiate Pain severity:  Severe Pain is:  Same all the time Onset quality:  Gradual Duration:  4 days Progression:  Worsening Chronicity:  New Context: not falling, not jumping from heights and not lifting heavy objects   Relieved by:  Nothing Worsened by:  Nothing Ineffective treatments:  None tried Associated symptoms: no abdominal pain and no weakness   Risk factors: no lack of exercise        Home Medications Prior to Admission medications   Not on File      Allergies    Bactrim and Penicillins    Review of Systems   Review of Systems  Gastrointestinal:  Negative for abdominal pain.  Musculoskeletal:  Positive for back pain.  Neurological:  Negative for weakness.  All other systems reviewed and are negative.   Physical Exam Updated Vital Signs BP 114/77 (BP Location: Right Arm)   Pulse 74   Temp (!) 97.3 F (36.3 C) (Oral)   Resp 18   Ht 6' (1.829 m)   Wt 70.3 kg   SpO2 99%   BMI 21.02 kg/m  Physical Exam Vitals and nursing note reviewed.  Constitutional:      General: He is not in acute distress.    Appearance: He is well-developed.  HENT:     Head: Normocephalic and atraumatic.  Eyes:     Conjunctiva/sclera: Conjunctivae normal.  Cardiovascular:     Rate and Rhythm: Normal rate and regular rhythm.     Heart sounds: No murmur heard. Pulmonary:     Effort: Pulmonary effort is normal. No respiratory distress.     Breath sounds: Normal breath sounds.  Abdominal:     Palpations: Abdomen is soft.     Tenderness: There is no abdominal tenderness.  Musculoskeletal:        General:  Tenderness present. No deformity.     Cervical back: Neck supple.     Comments: Tender right flank pain.  No change with movement,  from lower extremities   Skin:    General: Skin is warm and dry.     Capillary Refill: Capillary refill takes less than 2 seconds.  Neurological:     Mental Status: He is alert.  Psychiatric:        Mood and Affect: Mood normal.     ED Results / Procedures / Treatments   Labs (all labs ordered are listed, but only abnormal results are displayed) Labs Reviewed  URINALYSIS, ROUTINE W REFLEX MICROSCOPIC - Abnormal; Notable for the following components:      Result Value   Color, Urine STRAW (*)    Specific Gravity, Urine 1.003 (*)    Hgb urine dipstick SMALL (*)    All other components within normal limits    EKG None  Radiology CT Renal Stone Study  Result Date: 04/16/2022 CLINICAL DATA:  Flank pain, concern for kidney stone. EXAM: CT ABDOMEN AND PELVIS WITHOUT CONTRAST TECHNIQUE: Multidetector CT imaging of the abdomen and pelvis was performed following the standard protocol without IV contrast. RADIATION DOSE REDUCTION: This exam was performed according to the  departmental dose-optimization program which includes automated exposure control, adjustment of the mA and/or kV according to patient size and/or use of iterative reconstruction technique. COMPARISON:  CT abdomen pelvis dated 11/19/2013. FINDINGS: Lower chest: No acute abnormality. Hepatobiliary: No focal liver abnormality is seen. No gallstones, gallbladder wall thickening, or biliary dilatation. Pancreas: Unremarkable. No pancreatic ductal dilatation or surrounding inflammatory changes. Spleen: Normal in size without focal abnormality. Adrenals/Urinary Tract: A 10 mm left adrenal nodule measures below 10 Hounsfield units and is consistent with a benign lipid rich adrenal adenoma. No further imaging follow-up is recommended. The right adrenal gland appears normal. Kidneys are normal, without renal  calculi, focal lesion, or hydronephrosis. Bladder is unremarkable. Stomach/Bowel: Stomach is within normal limits. Appendix appears normal. No evidence of bowel wall thickening, distention, or inflammatory changes. Vascular/Lymphatic: Aortic atherosclerosis. No enlarged abdominal or pelvic lymph nodes. Reproductive: Prostate is unremarkable. Other: No abdominal wall hernia or abnormality. No abdominopelvic ascites. Musculoskeletal: No acute or significant osseous findings. IMPRESSION: No acute process in the abdomen or pelvis. No renal or ureteral calculi. Electronically Signed   By: Zerita Boers M.D.   On: 04/16/2022 15:03    Procedures Procedures    Medications Ordered in ED Medications - No data to display  ED Course/ Medical Decision Making/ A&P                           Medical Decision Making Pt complains of back pain  right side.  No injury    Amount and/or Complexity of Data Reviewed Labs: ordered. Decision-making details documented in ED Course.    Details: Ua  no blood Labs ordered reviewed and interpreted  Radiology: ordered and independent interpretation performed. Decision-making details documented in ED Course.    Details: Ct no acute  abnormality   Risk Risk Details: Pt counseled on results.  Pt given rx for ibuprofen and robaxin            Final Clinical Impression(s) / ED Diagnoses Final diagnoses:  Acute low back pain without sciatica, unspecified back pain laterality    Rx / DC Orders ED Discharge Orders          Ordered    diclofenac (VOLTAREN) 75 MG EC tablet  2 times daily        04/16/22 1524    methocarbamol (ROBAXIN) 500 MG tablet  4 times daily        04/16/22 1524           An After Visit Summary was printed and given to the patient.    Fransico Meadow, PA-C 04/16/22 [place], [place], DO 04/17/22 [phone]

## 2022-04-16 NOTE — ED Notes (Signed)
Patient transported to CT 

## 2022-04-16 NOTE — ED Triage Notes (Signed)
Back pain started last Thursday. Unknown reason for pain.

## 2022-04-16 NOTE — Discharge Instructions (Addendum)
Return if any problems.

## 2023-06-01 ENCOUNTER — Other Ambulatory Visit: Payer: Self-pay

## 2023-06-01 ENCOUNTER — Encounter (HOSPITAL_COMMUNITY): Payer: Self-pay | Admitting: Emergency Medicine

## 2023-06-01 ENCOUNTER — Emergency Department (HOSPITAL_COMMUNITY)
Admission: EM | Admit: 2023-06-01 | Discharge: 2023-06-01 | Disposition: A | Discharge status: Home / Self Care | Payer: Self-pay | Attending: Emergency Medicine | Admitting: Emergency Medicine

## 2023-06-01 ENCOUNTER — Emergency Department (HOSPITAL_COMMUNITY): Payer: Self-pay

## 2023-06-01 DIAGNOSIS — R0789 Other chest pain: Secondary | ICD-10-CM | POA: Insufficient documentation

## 2023-06-01 LAB — BASIC METABOLIC PANEL
Anion gap: 11 (ref 5–15)
BUN: 9 mg/dL (ref 6–20)
CO2: 26 mmol/L (ref 22–32)
Calcium: 9.3 mg/dL (ref 8.9–10.3)
Chloride: 102 mmol/L (ref 98–111)
Creatinine, Ser: 0.92 mg/dL (ref 0.61–1.24)
GFR, Estimated: >60 mL/min (ref >60)
Glucose, Bld: 109 mg/dL — ABNORMAL HIGH (ref 70–99)
Potassium: 3.6 mmol/L (ref 3.5–5.1)
Sodium: 139 mmol/L (ref 135–145)

## 2023-06-01 LAB — TROPONIN I (HIGH SENSITIVITY)
Troponin I (High Sensitivity): 13 ng/L (ref <18)
Troponin I (High Sensitivity): 7 ng/L (ref <18)

## 2023-06-01 LAB — CBC
HCT: 45.8 % (ref 39.0–52.0)
Hemoglobin: 15.3 g/dL (ref 13.0–17.0)
MCH: 30.2 pg (ref 26.0–34.0)
MCHC: 33.4 g/dL (ref 30.0–36.0)
MCV: 90.5 fL (ref 80.0–100.0)
Platelets: 307 10*3/uL (ref 150–400)
RBC: 5.06 MIL/uL (ref 4.22–5.81)
RDW: 13.5 % (ref 11.5–15.5)
WBC: 18.3 10*3/uL — ABNORMAL HIGH (ref 4.0–10.5)
nRBC: 0 % (ref 0.0–0.2)

## 2023-06-01 MED ORDER — ASPIRIN 81 MG PO CHEW
324.0000 mg | CHEWABLE_TABLET | Freq: Once | ORAL | Status: AC
  Administered 2023-06-01: 324 mg via ORAL
  Filled 2023-06-01: qty 4

## 2023-06-01 NOTE — ED Provider Notes (Signed)
[place] EMERGENCY DEPARTMENT AT [facility] Provider Note   CSN: [id number] Arrival date & time: 06/01/23  1446     History  Chief Complaint  Patient presents with   Chest Pain    Chad Hill is a 37 y.o. male.  37 year old male previously healthy presents emergency department with chest pressure.  Says that since this morning has had chest pressure that is 5/10 in severity.  Nonradiating.  Unsure if it is exertional.  Not pleuritic.  Does not change with eating.  No fever, diaphoresis, nausea, vomiting, cough, leg swelling.  Mild shortness of breath.  No history of DVT/PE, cancer, surgery in the past month, or hormone use.  No personal history of cardiac or lung disease.       Home Medications Prior to Admission medications   Medication Sig Start Date End Date Taking? Authorizing Provider  diclofenac (VOLTAREN) 75 MG EC tablet Take 1 tablet (75 mg total) by mouth 2 (two) times daily. 04/16/22   Elson Areas, PA-C  methocarbamol (ROBAXIN) 500 MG tablet Take 1 tablet (500 mg total) by mouth 4 (four) times daily. 04/16/22   Elson Areas, PA-C      Allergies    Bactrim and Penicillins    Review of Systems   Review of Systems  Physical Exam Updated Vital Signs BP 120/80   Pulse 80   Temp 98.1 F (36.7 C)   Resp 19   Ht 6' (1.829 m)   Wt 70.3 kg   SpO2 100%   BMI 21.02 kg/m  Physical Exam Vitals and nursing note reviewed.  Constitutional:      General: He is not in acute distress.    Appearance: He is well-developed.  HENT:     Head: Normocephalic and atraumatic.     Right Ear: External ear normal.     Left Ear: External ear normal.     Nose: Nose normal.  Eyes:     Extraocular Movements: Extraocular movements intact.     Conjunctiva/sclera: Conjunctivae normal.     Pupils: Pupils are equal, round, and reactive to light.  Cardiovascular:     Rate and Rhythm: Normal rate and regular rhythm.     Heart sounds: Normal heart sounds.      Comments: Radial pulses 2+ bilaterally Pulmonary:     Effort: Pulmonary effort is normal. No respiratory distress.     Breath sounds: Normal breath sounds.  Musculoskeletal:     Cervical back: Normal range of motion and neck supple.     Right lower leg: No edema.     Left lower leg: No edema.  Skin:    General: Skin is warm and dry.  Neurological:     Mental Status: He is alert. Mental status is at baseline.  Psychiatric:        Mood and Affect: Mood normal.        Behavior: Behavior normal.     ED Results / Procedures / Treatments   Labs (all labs ordered are listed, but only abnormal results are displayed) Labs Reviewed  BASIC METABOLIC PANEL - Abnormal; Notable for the following components:      Result Value   Glucose, Bld 109 (*)    All other components within normal limits  CBC - Abnormal; Notable for the following components:   WBC 18.3 (*)    All other components within normal limits  TROPONIN I (HIGH SENSITIVITY)  TROPONIN I (HIGH SENSITIVITY)    EKG EKG Interpretation  Date/Time:  Friday June 01 2023 15:01:53 EDT Ventricular Rate:  72 PR Interval:  160 QRS Duration:  84 QT Interval:  346 QTC Calculation: 378 R Axis:   103  Text Interpretation: Normal sinus rhythm Rightward axis Early repolarization No significant change since last tracing Abnormal ECG Confirmed by Gerhard Munch [id number]) on 06/01/2023 3:06:56 PM  Radiology DG Chest 2 View  Result Date: 06/01/2023 CLINICAL DATA:  Chest pain EXAM: CHEST - 2 VIEW COMPARISON:  04/06/2020 FINDINGS: The heart size and mediastinal contours are within normal limits. Both lungs are clear. The visualized skeletal structures are unremarkable. IMPRESSION: Normal study. Electronically Signed   By: Charlett Nose M.D.   On: 06/01/2023 18:07    Procedures Procedures    Medications Ordered in ED Medications  aspirin chewable tablet 324 mg (324 mg Oral Given 06/01/23 1827)    ED Course/ Medical Decision Making/ A&P                                  Medical Decision Making Amount and/or Complexity of Data Reviewed Labs: ordered. Radiology: ordered.  Risk OTC drugs.   Chad Hill is a 37 y.o. male previously healthy presents to the emergency department with chest pain  Initial Ddx:  MI, pericarditis, pneumonia, PE, costochondritis, GERD  MDM/Course:  Patient presents to the emergency department with chest pressure.  No exacerbating factors such as ambulation or eating.  No other concerning features such as radiation, diaphoresis, or vomiting.  On exam is overall very well-appearing.  EKG with early repolarization but no other acute changes.  Serial troponins WNL making ACS highly unlikely.  Considered PE but patient is PE RC negative.  Upon re-evaluation patient remained stable.  Will have him follow-up with his primary doctor and cardiology regarding his chest discomfort.  Unclear exactly what is causing it at this time but feel that life-threatening etiology highly unlikely.  This patient presents to the ED for concern of complaints listed in HPI, this involves an extensive number of treatment options, and is a complaint that carries with it a high risk of complications and morbidity. Disposition including potential need for admission considered.   Dispo: DC Home. Return precautions discussed including, but not limited to, those listed in the AVS. Allowed pt time to ask questions which were answered fully prior to dc.  Records reviewed Outpatient Clinic Notes The following labs were independently interpreted: Serial Troponins and show chronic anemia and no acute abnormality I independently reviewed the following imaging with scope of interpretation limited to determining acute life threatening conditions related to emergency care: Chest x-ray and agree with the radiologist interpretation with the following exceptions: none I personally reviewed and interpreted cardiac monitoring: normal sinus  rhythm  I personally reviewed and interpreted the pt's EKG: see above for interpretation  I have reviewed the patients home medications and made adjustments as needed  Portions of this note were generated with Dragon dictation software. Dictation errors may occur despite best attempts at proofreading.           Final Clinical Impression(s) / ED Diagnoses Final diagnoses:  Atypical chest pain    Rx / DC Orders ED Discharge Orders          Ordered    Ambulatory referral to Cardiology        06/01/23 1844              Rondel Baton,  MD 06/01/23 1847

## 2023-06-01 NOTE — Discharge Instructions (Signed)
You were seen for your chest pain in the emergency department.   At home, please take Tylenol and aspirin for chest pain.    Follow-up with your primary doctor in 2-3 days regarding your visit.  Cardiology will be calling you regarding an appointment within the next 72 hours.  You may contact them if you do not hear from them in that time using the information in this packet.  Return immediately to the emergency department if you experience any of the following: Worsening pain, difficulty breathing, unexplained vomiting or sweating, or any other concerning symptoms.    Thank you for visiting our Emergency Department. It was a pleasure taking care of you today.

## 2023-06-01 NOTE — ED Triage Notes (Signed)
Pt here from home with c/o chest pain center of chest non radiating , no n/v some slight sob

## 2023-11-10 ENCOUNTER — Emergency Department (HOSPITAL_COMMUNITY): Payer: Self-pay

## 2023-11-10 ENCOUNTER — Other Ambulatory Visit: Payer: Self-pay

## 2023-11-10 ENCOUNTER — Emergency Department (HOSPITAL_COMMUNITY)
Admission: EM | Admit: 2023-11-10 | Discharge: 2023-11-11 | Disposition: A | Discharge status: Home / Self Care | Payer: Self-pay | Attending: Emergency Medicine | Admitting: Emergency Medicine

## 2023-11-10 ENCOUNTER — Encounter (HOSPITAL_COMMUNITY): Payer: Self-pay

## 2023-11-10 DIAGNOSIS — S61012A Laceration without foreign body of left thumb without damage to nail, initial encounter: Secondary | ICD-10-CM | POA: Insufficient documentation

## 2023-11-10 DIAGNOSIS — Z23 Encounter for immunization: Secondary | ICD-10-CM | POA: Insufficient documentation

## 2023-11-10 DIAGNOSIS — W260XXA Contact with knife, initial encounter: Secondary | ICD-10-CM | POA: Insufficient documentation

## 2023-11-10 DIAGNOSIS — Z87891 Personal history of nicotine dependence: Secondary | ICD-10-CM | POA: Insufficient documentation

## 2023-11-10 MED ORDER — TETANUS-DIPHTH-ACELL PERTUSSIS 5-2.5-18.5 LF-MCG/0.5 IM SUSY
0.5000 mL | PREFILLED_SYRINGE | Freq: Once | INTRAMUSCULAR | Status: AC
  Administered 2023-11-10: 0.5 mL via INTRAMUSCULAR
  Filled 2023-11-10: qty 0.5

## 2023-11-10 MED ORDER — LIDOCAINE HCL (PF) 1 % IJ SOLN
5.0000 mL | Freq: Once | INTRAMUSCULAR | Status: AC
  Administered 2023-11-10: 5 mL via INTRADERMAL
  Filled 2023-11-10: qty 5

## 2023-11-10 NOTE — ED Provider Notes (Signed)
 [place] EMERGENCY DEPARTMENT AT [facility] Provider Note   CSN: [id number] Arrival date & time: 11/10/23  2202     History {Add pertinent medical, surgical, social history, OB history to HPI:1} Chief Complaint  Patient presents with  . Laceration    Chad Hill is a 38 y.o. male.   Laceration Patient presents for thumb laceration.  Medical history includes tobacco use.  At approximately 9 PM, patient was getting some fish and accidentally slipped and cut his posterior left thumb with a knife.  After arrival in the ED, he did wash it out some.  He denies any other areas of suspected injuries.  He is unaware when his last tetanus shot was.     Home Medications Prior to Admission medications   Medication Sig Start Date End Date Taking? Authorizing Provider  diclofenac (VOLTAREN) 75 MG EC tablet Take 1 tablet (75 mg total) by mouth 2 (two) times daily. 04/16/22   Sofia, Leslie K, PA-C  methocarbamol (ROBAXIN) 500 MG tablet Take 1 tablet (500 mg total) by mouth 4 (four) times daily. 04/16/22   Sofia, Leslie K, PA-C      Allergies    Bactrim and Penicillins    Review of Systems   Review of Systems  Skin:  Positive for wound.  All other systems reviewed and are negative.   Physical Exam Updated Vital Signs BP 119/76 (BP Location: Right Arm)   Pulse 70   Temp 98.9 F (37.2 C) (Oral)   Resp 16   Ht 6' (1.829 m)   Wt 68 kg   SpO2 97%   BMI 20.34 kg/m  Physical Exam Vitals and nursing note reviewed.  Constitutional:      General: He is not in acute distress.    Appearance: Normal appearance. He is well-developed. He is not ill-appearing, toxic-appearing or diaphoretic.  HENT:     Head: Normocephalic and atraumatic.     Right Ear: External ear normal.     Left Ear: External ear normal.     Nose: Nose normal.     Mouth/Throat:     Mouth: Mucous membranes are moist.  Eyes:     Extraocular Movements: Extraocular movements intact.      Conjunctiva/sclera: Conjunctivae normal.  Cardiovascular:     Rate and Rhythm: Normal rate and regular rhythm.  Pulmonary:     Effort: Pulmonary effort is normal. No respiratory distress.  Abdominal:     General: There is no distension.     Palpations: Abdomen is soft.  Musculoskeletal:        General: Signs of injury present. No swelling or deformity.     Cervical back: Normal range of motion and neck supple.  Skin:    General: Skin is warm and dry.     Coloration: Skin is not jaundiced or pale.  Neurological:     General: No focal deficit present.     Mental Status: He is alert and oriented to person, place, and time.  Psychiatric:        Mood and Affect: Mood normal.        Behavior: Behavior normal.    ED Results / Procedures / Treatments   Labs (all labs ordered are listed, but only abnormal results are displayed) Labs Reviewed - No data to display  EKG None  Radiology No results found.  Procedures Procedures  {Document cardiac monitor, telemetry assessment procedure when appropriate:1}  Medications Ordered in ED Medications - No data to display  ED Course/ Medical Decision Making/ A&P   {   Click here for ABCD2, HEART and other calculatorsREFRESH Note before signing :1}                              Medical Decision Making  Patient presenting for accidental laceration to left thumb.  This occurred shortly prior to arrival.  He was cutting some fish with a knife at the time.  He does describe a dirty knife blade.  He was able to watch her diet after his arrival in the ED waiting room.  On exam, patient is well-appearing.  Laceration is superficial and currently hemostatic.  Flexion and extension of thumb are intact.  He has no areas of numbness.  Patient was given water to soak his thumb in.  Will update tetanus today.***  {Document critical care time when appropriate:1} {Document review of labs and clinical decision tools ie heart score, Chads2Vasc2 etc:1}   {Document your independent review of radiology images, and any outside records:1} {Document your discussion with family members, caretakers, and with consultants:1} {Document social determinants of health affecting pt's care:1} {Document your decision making why or why not admission, treatments were needed:1} Final Clinical Impression(s) / ED Diagnoses Final diagnoses:  None    Rx / DC Orders ED Discharge Orders     None

## 2023-11-10 NOTE — ED Triage Notes (Signed)
 Pt arrived from home via POV w laceration to left thumb approx 3cm in length. Bleeding is controlled at this time.

## 2023-11-11 NOTE — Discharge Instructions (Signed)
 Sutures will need to be removed in 7 to 10 days.  If area becomes increasingly red, painful, swollen, or begins draining pus, please return to the emergency department.
